# Patient Record
Sex: Male | Born: 1955 | Race: White | Hispanic: No | Smoking: Current every day smoker
Health system: Southern US, Community
[De-identification: ages and names within clinical notes are randomized; demographics above are authoritative.]

## PROBLEM LIST (undated history)

## (undated) DIAGNOSIS — R0602 Shortness of breath: Secondary | ICD-10-CM

## (undated) DIAGNOSIS — I739 Peripheral vascular disease, unspecified: Secondary | ICD-10-CM

## (undated) DIAGNOSIS — I77819 Aortic ectasia, unspecified site: Secondary | ICD-10-CM

## (undated) DIAGNOSIS — I10 Essential (primary) hypertension: Secondary | ICD-10-CM

## (undated) DIAGNOSIS — H9190 Unspecified hearing loss, unspecified ear: Secondary | ICD-10-CM

## (undated) DIAGNOSIS — M5430 Sciatica, unspecified side: Secondary | ICD-10-CM

## (undated) DIAGNOSIS — R011 Cardiac murmur, unspecified: Secondary | ICD-10-CM

## (undated) DIAGNOSIS — F1010 Alcohol abuse, uncomplicated: Secondary | ICD-10-CM

## (undated) DIAGNOSIS — Z0389 Encounter for observation for other suspected diseases and conditions ruled out: Secondary | ICD-10-CM

## (undated) DIAGNOSIS — I359 Nonrheumatic aortic valve disorder, unspecified: Secondary | ICD-10-CM

## (undated) DIAGNOSIS — R002 Palpitations: Secondary | ICD-10-CM

## (undated) DIAGNOSIS — M79609 Pain in unspecified limb: Secondary | ICD-10-CM

## (undated) DIAGNOSIS — G8921 Chronic pain due to trauma: Secondary | ICD-10-CM

## (undated) HISTORY — DX: Shortness of breath: R06.02

## (undated) HISTORY — DX: Peripheral vascular disease, unspecified: I73.9

## (undated) HISTORY — DX: Encounter for observation for other suspected diseases and conditions ruled out: Z03.89

## (undated) HISTORY — DX: Unspecified hearing loss, unspecified ear: H91.90

## (undated) HISTORY — DX: Nonrheumatic aortic valve disorder, unspecified: I35.9

## (undated) HISTORY — DX: Pain in unspecified limb: M79.609

## (undated) HISTORY — DX: Chronic pain due to trauma: G89.21

## (undated) HISTORY — DX: Palpitations: R00.2

## (undated) HISTORY — DX: Aortic ectasia, unspecified site: I77.819

## (undated) HISTORY — DX: Sciatica, unspecified side: M54.30

## (undated) HISTORY — DX: Alcohol abuse, uncomplicated: F10.10

## (undated) HISTORY — PX: WISDOM TOOTH EXTRACTION: SHX21

---

## 1979-03-28 HISTORY — PX: OTHER SURGICAL HISTORY: SHX169

## 1979-03-28 HISTORY — PX: ABDOMINAL SURGERY: SHX537

## 1982-03-27 HISTORY — PX: OTHER SURGICAL HISTORY: SHX169

## 1997-07-22 ENCOUNTER — Ambulatory Visit (HOSPITAL_BASED_OUTPATIENT_CLINIC_OR_DEPARTMENT_OTHER): Admission: RE | Admit: 1997-07-22 | Discharge: 1997-07-22 | Payer: Self-pay | Admitting: Orthopedic Surgery

## 2006-04-24 ENCOUNTER — Ambulatory Visit: Payer: Self-pay | Admitting: Cardiology

## 2010-05-19 ENCOUNTER — Encounter: Payer: Self-pay | Admitting: Cardiovascular Disease

## 2010-05-20 ENCOUNTER — Encounter: Payer: Self-pay | Admitting: Cardiovascular Disease

## 2010-05-24 ENCOUNTER — Encounter: Payer: Self-pay | Admitting: Cardiovascular Disease

## 2010-05-24 DIAGNOSIS — R011 Cardiac murmur, unspecified: Secondary | ICD-10-CM

## 2010-06-03 ENCOUNTER — Encounter: Payer: Self-pay | Admitting: Physician Assistant

## 2010-06-03 ENCOUNTER — Ambulatory Visit (INDEPENDENT_AMBULATORY_CARE_PROVIDER_SITE_OTHER): Payer: Medicaid Other | Admitting: Cardiovascular Disease

## 2010-06-03 ENCOUNTER — Encounter (INDEPENDENT_AMBULATORY_CARE_PROVIDER_SITE_OTHER): Payer: Self-pay | Admitting: *Deleted

## 2010-06-03 DIAGNOSIS — I10 Essential (primary) hypertension: Secondary | ICD-10-CM | POA: Insufficient documentation

## 2010-06-03 DIAGNOSIS — I739 Peripheral vascular disease, unspecified: Secondary | ICD-10-CM | POA: Insufficient documentation

## 2010-06-03 DIAGNOSIS — R0602 Shortness of breath: Secondary | ICD-10-CM

## 2010-06-03 DIAGNOSIS — E785 Hyperlipidemia, unspecified: Secondary | ICD-10-CM | POA: Insufficient documentation

## 2010-06-03 DIAGNOSIS — R002 Palpitations: Secondary | ICD-10-CM | POA: Insufficient documentation

## 2010-06-03 DIAGNOSIS — F17200 Nicotine dependence, unspecified, uncomplicated: Secondary | ICD-10-CM | POA: Insufficient documentation

## 2010-06-07 NOTE — Letter (Signed)
Summary: Lexiscan or Dobutamine Pharmacist, community at [facility]  [address]   [place], [place] [address]   Phone: [phone]  Fax: [phone]      [facility] Cardiovascular Services  Lexiscan or Dobutamine Cardiolite Strss Test    [facility]  Appointment Date:_  Appointment Time:_  Your doctor has ordered a CARDIOLITE STRESS TEST using a medication to stimulate exercise so that you will not have to walk on the treadmill to determine the condition of your heart during stress. If you take blood pressure medication, ask your doctor if you should take it the day of your test. You should not have anything to eat or drink at least 4 hours before your test is scheduled, and no caffeine, including decaffeinated tea and coffee, chocolate, and soft drinks for 24 hours before your test.  You will need to register at the Outpatient/Main Entrance at the hospital 15 minutes before your appointment time. It is a good idea to bring a copy of your order with you. They will direct you to the Diagnostic Imaging (Radiology) Department.  You will be asked to undress from the waist up and given a hospital gown to wear, so dress comfortably from the waist down for example: Sweat pants, shorts, or skirt Rubber soled lace up shoes (tennis shoes)  Plan on about three hours from registration to release from the hospital  You may take your medications with water the morning of your test.

## 2010-06-07 NOTE — Letter (Signed)
Summary: External Correspondence/  [facility]  External Correspondence/  [facility]   Imported By: Dorise Hiss 05/31/2010 10:13:55  _____________________________________________________________________  External Attachment:    Type:   Image     Comment:   External Document

## 2010-06-07 NOTE — Assessment & Plan Note (Signed)
Summary: NP-ABNORMAL ECHO & EKG   Visit Type:  Initial Consult Primary Provider:  Farrell Ours   History of Present Illness: patient is a 55 year old male, with no known history for disease, now referred for evaluation of an abnormal echocardiogram.  Patient recently established with Dr. Pauletta Browns office. He presented on no medications. He referred to remote history of "murmur", but had never undergone prior echocardiography. He was referred for a 2-D echo, reviewed by Dr. Andee Lineman, indicating normal LVF (EF 55-60%), with mild concentric LVH, and evidence of diastolic dysfunction. With respect to the valvular heart disease, he was found to have mild/moderate AR, and mild PR. There was also moderate dilatation of the descending aorta.  Clinically, he denies any exertional chest pain. However, he reports progressive DOE, over the past 2-3 years. He denies any symptoms suggestive of CHF. He also suggests possible RLE claudication, with associated right hip pain.  A recent 12-lead EKG, from Dr. Pauletta Browns office, demonstrated NSR at 60 BPM, with normal axis and no ischemic changes.  Preventive Screening-Counseling & Management  Alcohol-Tobacco     Smoking Status: current     Smoking Cessation Counseling: yes     Packs/Day: >1/2 PPD     Year Started: 2004  Current Medications (verified): 1)  Diclofenac Sodium 75 Mg Tbec (Diclofenac Sodium) .... Take 1 Tablet By Mouth Two Times A Day(Patient Hasn't Taken in 2 Days)  Allergies (verified): No Known Drug Allergies  Comments:  Nurse/Medical Assistant: The patient's medication list and allergies were reviewed with the patient and were updated in the Medication and Allergy Lists.  Past History:  Past Medical History: normal LVF by 2-D echo, 2/12 Valvular heart disease... 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 All other systems negative.   Vital Signs:  Patient profile:   55 year old male Height:      68 inches Weight:      192 pounds BMI:     29.30 Pulse rate:   66 / minute BP sitting:   151 / 78  (left arm) Cuff size:   large  Vitals Entered By: Carlye Grippe (June 03, 2010 2:16 PM)  Nutrition Counseling: Patient's BMI is greater than 25 and therefore counseled on weight management options.  Physical Exam  Additional Exam:  GEN: 55 year old male, sitting upright, in no distress HEENT: NCAT,PERRLA,EOMI NECK: palpable pulses, no bruits; no JVD; no TM LUNGS: CTA bilaterally HEART: RRR (S1S2); harsh, grade 2-3/6 systolic ejection murmur at the base extending to the apex; 2/6 diastolic blow along the LSB; no rubs; no gallops ABD: soft, NT; intact BS EXT: palpable below formal pulses, with soft right-sided bruit; diminished right peripheral pulses, with brisk left peripheral pulses; trace pedal edema SKIN: warm, dry MUSC: no obvious deformity NEURO: A/O (x3)     Impression & Recommendations:  Problem # 1:  SHORTNESS OF BREATH (ICD-786.05)  recent echocardiogram indicated normal LVF, but with moderate AR and evidence of diastolic dysfunction. Will schedule Lexiscan Cardiolite to rule out occult ischemia, given that his progressive DOE current represent an anginal equivalent. Will start low-dose aspirin for primary prophylaxis. Will arrange early clinic followup with myself and Dr. Andee Lineman in one month, for review of studies  and further recommendations.  Problem # 2:  HYPERTENSION (ICD-401.9)  Will initiate antihypertensive treatment with lisinopril 5 mg daily. Check BMET in one week.  Problem # 3:  CLAUDICATION, INTERMITTENT (ICD-443.9)  assess lipid status with FLP/LFT  profile.  Problem # 4:  CLAUDICATION, INTERMITTENT (ICD-443.9)  Will schedule LE segmental ABIs, to rule out significant PAD, particularly on the right side.  Problem # 5:  TOBACCO ABUSE (ICD-305.1)  the importance of smoking cessation was discussed, and patient appears motivated to do so.  Other Orders: T-Lipid Profile [phone]) T-Hepatic Function [phone]) T-Basic Metabolic Panel [id number]) ABI (ABI) Nuclear Med (Nuc Med)  Patient Instructions: 1)  Follow up with Dr. Earnestine Leys on  [facility] July 18, 2010 AT 3:15PM. 2)  Your physician discussed the risks, benefits and indications for preventive aspirin therapy. It is recommended that you start (or continue) taking 81 mg of aspirin a day. 3)  Start Lisinopril 5mg  by mouth once daily. 4)  Your physician recommends that you go to the [facility] for lab work: DO IN 1 WEEK. DO NOT EAT OR DRINK AFTER MIDNIGHT. 5)  Your physician has requested that you have an ankle brachial index (ABI). During this test an ultrasound and blood pressure cuff are used to evaluate the arteries that supply the arms and legs with blood. Allow thirty minutes for this exam. There are no restrictions or special instructions. 6)  Your physician has requested that you have an Best boy.  For further information please visit [email or web address]  Please follow instruction sheet, as given. 7)  Your physician discussed the hazards of tobacco use.  Tobacco use cessation is recommended and techniques and options to help you quit were discussed. Prescriptions: LISINOPRIL 5 MG TABS (LISINOPRIL) Take one tablet by mouth daily  #30 x 6   Entered by:   Cyril Loosen, RN, BSN   Authorized by:   Nelida Meuse, PA-C   Signed by:   Cyril Loosen, RN, BSN on 06/03/2010   Method used:   Electronically to        [facility] [phone]* (retail)       [address] [place], [place]  [address]       Ph: [phone] or  [phone]       Fax: [phone]   RxID:   [id number]   Handout requested.  Nelida Meuse, PA-C  June 03, 2010 3:24 PM I have reviewed and approved all prescriptions at the time of the office visit.

## 2010-06-09 ENCOUNTER — Encounter: Payer: Self-pay | Admitting: Physician Assistant

## 2010-06-09 DIAGNOSIS — R072 Precordial pain: Secondary | ICD-10-CM

## 2010-07-18 ENCOUNTER — Encounter: Payer: Self-pay | Admitting: Physician Assistant

## 2010-07-18 ENCOUNTER — Encounter: Payer: Self-pay | Admitting: Cardiology

## 2010-07-18 ENCOUNTER — Ambulatory Visit (INDEPENDENT_AMBULATORY_CARE_PROVIDER_SITE_OTHER): Payer: Medicaid Other | Admitting: Cardiology

## 2010-07-18 VITALS — BP 153/79 | HR 68 | Ht 68.0 in | Wt 194.0 lb

## 2010-07-18 DIAGNOSIS — I359 Nonrheumatic aortic valve disorder, unspecified: Secondary | ICD-10-CM

## 2010-07-18 DIAGNOSIS — I351 Nonrheumatic aortic (valve) insufficiency: Secondary | ICD-10-CM | POA: Insufficient documentation

## 2010-07-18 DIAGNOSIS — I10 Essential (primary) hypertension: Secondary | ICD-10-CM

## 2010-07-18 DIAGNOSIS — R0602 Shortness of breath: Secondary | ICD-10-CM

## 2010-07-18 DIAGNOSIS — I739 Peripheral vascular disease, unspecified: Secondary | ICD-10-CM

## 2010-07-18 DIAGNOSIS — I77819 Aortic ectasia, unspecified site: Secondary | ICD-10-CM

## 2010-07-18 MED ORDER — LISINOPRIL 10 MG PO TABS: 10.0000 mg | ORAL_TABLET | Freq: Every day | ORAL | Status: DC

## 2010-07-18 NOTE — Progress Notes (Signed)
HPI The patient is a 55 year old male seen recently in the office for a history of murmur. An echocardiograph indicated the patient has mild to moderate aortic regurgitation and mild pulmonary regurgitation. Associated with this is moderate dilatation of the descending aorta. His ejection fraction is 55-60% mild concentric LVH. Clinically he reported progressive exertional chest pain and dyspnea exertional the last several years. He had no heart failure symptoms. There was question of right lower extremity claudication associated with right hip pain the patient has had prior from a to his right leg. ABIs were performed and showed no significant peripheral vascular disease. It appears that at some point the patient needs a hip replacement. Because of his symptoms of dyspnea and chest pain Cardiolite study was ordered which showed normal perfusion with an ejection fraction of 48%, discordant from the findings on echocardiogram. Of note also was that the end-diastolic volume is 207 mL and end-systolic volume 107 mL. It appears that the patient has some ventricular dilatation associated with this aortic insufficiency. Presently the patient's blood pressure however is not controlled. His only lisinopril 5 mg by mouth daily. His blood pressures in the 150-160 mmHg range.  No Known Allergies  No current outpatient prescriptions on file prior to visit.    Past Medical History  Diagnosis Date  . Undiagnosed cardiac murmurs   . Heart disease, unspecified   . Aortic valve disorders   . Pulmonary valve disorders   . Aortic ectasia, unspecified site   . Peripheral vascular disease, unspecified   . Shortness of breath   . Palpitations   . Chronic pain due to trauma   . Limb pain   . Problems with hearing   . ETOH abuse     Past Surgical History  Procedure Date  . Knee and leg orif 1984    TRAUMA  . Right forearm orif 1981    TRAUMA  . Wisdom tooth extraction   . Plastic surgery left eye 1981   TRAUMA    No family history on file.  History   Social History  . Marital Status: Legally Separated    Spouse Name: N/A    Number of Children: 2  . Years of Education: N/A   Occupational History  . PAINTER    Social History Main Topics  . Smoking status: Current Everyday Smoker -- 0.8 packs/day for 8 years    Types: Cigarettes  . Smokeless tobacco: Not on file  . Alcohol Use: Yes  . Drug Use: Not on file  . Sexually Active: Not on file   Other Topics Concern  . Not on file   Social History Narrative  . No narrative on file   Review of systems:Pertinent positives as outlined above. The remainder of the 18  point review of systems is negative  PHYSICAL EXAM BP 153/79  Pulse 68  Ht 5\' 8"  (1.727 m)  Wt 194 lb (87.998 kg)  BMI 29.50 kg/m2  General: Well-developed, well-nourished in no distress Head: Normocephalic and atraumatic Eyes:PERRLA/EOMI intact, conjunctiva and lids normal Ears: No deformity or lesions Mouth:normal dentition, normal posterior pharynx Neck: Supple, no JVD.  No masses, thyromegaly or abnormal cervical nodes Lungs: Normal breath sounds bilaterally without wheezing.  Normal percussion Cardiac: regular rate and rhythm with normal S1 and S2, no S3 or S4.  PMI is normal.  No pathological murmurs 2/6 crescendo decrescendo systolic murmur as well as a 2/6 diastolic murmur at the left lower sternal border Abdomen: Normal bowel sounds, abdomen is soft  and nontender without masses, organomegaly or hernias noted.  No hepatosplenomegaly MSK: Back normal, normal gait muscle strength and tone normal Vascular: Pulse is normal in all 4 extremities Extremities: No peripheral pitting edema Neurologic: Alert and oriented x 3 Skin: Intact without lesions or rashes Lymphatics: No significant adenopathy Psychologic: Normal affect  ECG:  ASSESSMENT AND PLAN

## 2010-07-18 NOTE — Assessment & Plan Note (Signed)
Mild to moderate aortic insufficiency: The patient will need to be followed for significant aortic insufficiency. We will obtain a followup echocardiogram in one year. We'll also obtain an MRI to evaluate his aortic dilatation. The patient will need to be monitored monitor for progressive LV dilatation, declining ejection fraction her exercise tolerance. Ejection fraction is still 55-60% by echocardiogram the left ventricle end-diastolic dimension is less than 75 mm.

## 2010-07-18 NOTE — Assessment & Plan Note (Signed)
The patient needs aggressive blood pressure control and I increased his lisinopril to 10 mg by mouth daily. She will then go for a nurse visit to further increase his blood pressure dropped to needed. The addition of a diuretic may be needed. I would avoid use of a beta blocker in the setting of aortic regurgitation. He will need a followup echocardiogram on a yearly basis.

## 2010-07-18 NOTE — Patient Instructions (Signed)
   Increase Lisinopril to 10mg  daily  Nurse visit next week for blood pressure check - if BP > 140, then will further increase Lisinopril to 20mg  daily  If above change is made, will need lab (BMET) done in 7-10 days after this. Your physician wants you to follow up in: 6 months.  You will receive a reminder letter in the mail one-two months in advance.  If you don't receive a letter, please call our office to schedule the follow up appointment

## 2010-07-18 NOTE — Assessment & Plan Note (Signed)
No further follow up required.

## 2010-07-18 NOTE — Assessment & Plan Note (Signed)
No further ischemia workup required. I suspect the patient's shortness of breath is mainly due to deconditioning, certainly at this point and not related to his mild to moderate aortic regurgitation.

## 2010-07-22 ENCOUNTER — Encounter: Payer: Self-pay | Admitting: *Deleted

## 2010-07-22 ENCOUNTER — Ambulatory Visit (INDEPENDENT_AMBULATORY_CARE_PROVIDER_SITE_OTHER): Payer: Medicaid Other | Admitting: *Deleted

## 2010-07-22 VITALS — BP 135/83 | HR 66 | Ht 68.0 in | Wt 193.0 lb

## 2010-07-22 DIAGNOSIS — I10 Essential (primary) hypertension: Secondary | ICD-10-CM

## 2010-08-06 NOTE — Progress Notes (Signed)
Blood pressure stable no further adjustment in medications

## 2010-08-06 NOTE — Progress Notes (Signed)
Blood pressure stable no further adjustment in medications 

## 2010-08-25 ENCOUNTER — Encounter: Payer: Self-pay | Admitting: *Deleted

## 2010-08-30 ENCOUNTER — Encounter: Payer: Self-pay | Admitting: *Deleted

## 2010-08-30 NOTE — Progress Notes (Signed)
Pt mailed certified letter on 08/25/10 regarding lab work for FLP/LFT/BMET ordered by Gene in March.  Received response card on 6/5 signed by pt on 08/29/10.

## 2010-10-14 ENCOUNTER — Encounter: Payer: Self-pay | Admitting: Physician Assistant

## 2011-02-21 ENCOUNTER — Encounter (HOSPITAL_COMMUNITY): Payer: Self-pay | Admitting: Pharmacy Technician

## 2011-02-21 ENCOUNTER — Telehealth: Payer: Self-pay | Admitting: *Deleted

## 2011-02-21 ENCOUNTER — Ambulatory Visit (INDEPENDENT_AMBULATORY_CARE_PROVIDER_SITE_OTHER): Payer: Medicaid Other | Admitting: Cardiology

## 2011-02-21 ENCOUNTER — Encounter: Payer: Self-pay | Admitting: *Deleted

## 2011-02-21 ENCOUNTER — Encounter: Payer: Self-pay | Admitting: Cardiology

## 2011-02-21 VITALS — BP 134/74 | HR 75 | Ht 68.0 in | Wt 193.0 lb

## 2011-02-21 DIAGNOSIS — I359 Nonrheumatic aortic valve disorder, unspecified: Secondary | ICD-10-CM

## 2011-02-21 DIAGNOSIS — I10 Essential (primary) hypertension: Secondary | ICD-10-CM

## 2011-02-21 DIAGNOSIS — I351 Nonrheumatic aortic (valve) insufficiency: Secondary | ICD-10-CM

## 2011-02-21 DIAGNOSIS — R0602 Shortness of breath: Secondary | ICD-10-CM

## 2011-02-21 DIAGNOSIS — I77819 Aortic ectasia, unspecified site: Secondary | ICD-10-CM
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
## 2011-02-21 NOTE — Patient Instructions (Signed)
   Left & right heart cath - see info sheet given Follow up will be given at time of discharge from above

## 2011-02-21 NOTE — Assessment & Plan Note (Signed)
Clinically I physical examination the patient does not appear to have severe aortic insufficiency he does however have left ventricular dilatation but also has a prior history of significant hypertension. Although Cardiolite study earlier this year was negative for ischemia, clinically the patient has worsening symptoms of dyspnea and now also has developed symptoms of chest tightness. I suggested to the patient that we should proceed with a comprehensive examination with left and right heart catheterization, particularly light of the fact that the patient never had this MRI done either. I discussed the case with Dr. Kirke Corin and he will be scheduled next week for left and right heart catheterization. I discussed with the patient the risks and benefits of the procedure and is willing to proceed.

## 2011-02-21 NOTE — Assessment & Plan Note (Signed)
Blood pressures controlled with lisinopril.

## 2011-02-21 NOTE — Telephone Encounter (Signed)
Left & right heart cath + aortogram with Dr. Kirke Corin - scheduled for Wednesday, 12/5 at 8:30 AM - main lab.

## 2011-02-21 NOTE — Telephone Encounter (Signed)
No precert required 

## 2011-02-21 NOTE — Assessment & Plan Note (Signed)
May still need a formal evaluation either with CT scan or MRI pending review of the angiogram. We will leave the  decision to Dr. Kirke Corin after he has a chance to review the study.

## 2011-02-21 NOTE — Assessment & Plan Note (Signed)
No evidence of peripheral vascular disease with normal ABIs April 2012

## 2011-02-21 NOTE — Assessment & Plan Note (Addendum)
Further evaluation as outlined above although I suspect there may be a large component of deconditioning involved also, in addition to symptoms related to his depression.

## 2011-02-27 ENCOUNTER — Other Ambulatory Visit: Payer: Self-pay | Admitting: *Deleted

## 2011-02-27 DIAGNOSIS — I10 Essential (primary) hypertension: Secondary | ICD-10-CM

## 2011-02-27 MED ORDER — LISINOPRIL 10 MG PO TABS: 10.0000 mg | ORAL_TABLET | Freq: Every day | ORAL | Status: DC

## 2011-02-28 ENCOUNTER — Telehealth: Payer: Self-pay | Admitting: *Deleted

## 2011-02-28 NOTE — Telephone Encounter (Signed)
Patient previously scheduled for heart catherization on Wednesday, 12/5 with Dr. Kirke Corin at 8:30 AM - main cath lab.  Had to reschedule due to transportation/family issues.    Rescheduled for Wednesday, 12/12 at 8:30 AM.

## 2011-03-01 NOTE — Telephone Encounter (Signed)
Called and informed [facility] cath lab.

## 2011-03-07 ENCOUNTER — Other Ambulatory Visit: Payer: Self-pay | Admitting: Cardiology

## 2011-03-07 DIAGNOSIS — I2510 Atherosclerotic heart disease of native coronary artery without angina pectoris: Secondary | ICD-10-CM

## 2011-03-08 ENCOUNTER — Encounter (HOSPITAL_COMMUNITY)
Admission: RE | Disposition: A | Discharge status: Home / Self Care | Payer: Self-pay | Source: Ambulatory Visit | Attending: Cardiovascular Disease

## 2011-03-08 ENCOUNTER — Ambulatory Visit (HOSPITAL_COMMUNITY)
Admission: RE | Admit: 2011-03-08 | Discharge: 2011-03-08 | Disposition: A | Discharge status: Home / Self Care | Payer: Medicaid Other | Source: Ambulatory Visit | Attending: Cardiovascular Disease | Admitting: Cardiovascular Disease

## 2011-03-08 ENCOUNTER — Other Ambulatory Visit: Payer: Self-pay

## 2011-03-08 DIAGNOSIS — R079 Chest pain, unspecified: Secondary | ICD-10-CM | POA: Insufficient documentation

## 2011-03-08 DIAGNOSIS — I2510 Atherosclerotic heart disease of native coronary artery without angina pectoris: Secondary | ICD-10-CM

## 2011-03-08 DIAGNOSIS — I359 Nonrheumatic aortic valve disorder, unspecified: Secondary | ICD-10-CM

## 2011-03-08 DIAGNOSIS — R0989 Other specified symptoms and signs involving the circulatory and respiratory systems: Secondary | ICD-10-CM | POA: Insufficient documentation

## 2011-03-08 DIAGNOSIS — R0609 Other forms of dyspnea: Secondary | ICD-10-CM | POA: Insufficient documentation

## 2011-03-08 DIAGNOSIS — R0602 Shortness of breath: Secondary | ICD-10-CM

## 2011-03-08 HISTORY — PX: LEFT AND RIGHT HEART CATHETERIZATION WITH CORONARY ANGIOGRAM: SHX5449

## 2011-03-08 LAB — POCT I-STAT 3, VENOUS BLOOD GAS (G3P V)
Acid-base deficit: 10 mmol/L — ABNORMAL HIGH (ref 0.0–2.0)
Bicarbonate: 19.1 mEq/L — ABNORMAL LOW (ref 20.0–24.0)
Bicarbonate: 26.6 mEq/L — ABNORMAL HIGH (ref 20.0–24.0)
O2 Saturation: 58 %
O2 Saturation: 64 %
TCO2: 21 mmol/L (ref 0–100)
TCO2: 28 mmol/L (ref 0–100)
pCO2, Ven: 52.7 mmHg — ABNORMAL HIGH (ref 45.0–50.0)
pCO2, Ven: 53.5 mmHg — ABNORMAL HIGH (ref 45.0–50.0)
pH, Ven: 7.168 — CL (ref 7.250–7.300)
pO2, Ven: 37 mmHg (ref 30.0–45.0)
pO2, Ven: 39 mmHg (ref 30.0–45.0)

## 2011-03-08 LAB — POCT I-STAT 3, ART BLOOD GAS (G3+)
Bicarbonate: 25.9 mEq/L — ABNORMAL HIGH (ref 20.0–24.0)
O2 Saturation: 98 %
TCO2: 27 mmol/L (ref 0–100)
pCO2 arterial: 47.7 mmHg — ABNORMAL HIGH (ref 35.0–45.0)
pH, Arterial: 7.343 — ABNORMAL LOW (ref 7.350–7.450)
pO2, Arterial: 106 mmHg — ABNORMAL HIGH (ref 80.0–100.0)

## 2011-03-08 LAB — BASIC METABOLIC PANEL
BUN: 13 mg/dL (ref 6–23)
CO2: 26 mEq/L (ref 19–32)
Chloride: 101 mEq/L (ref 96–112)
Creatinine, Ser: 0.67 mg/dL (ref 0.50–1.35)
Glucose, Bld: 98 mg/dL (ref 70–99)

## 2011-03-08 LAB — CBC
HCT: 43 % (ref 39.0–52.0)
MCH: 32.5 pg (ref 26.0–34.0)
MCHC: 34.2 g/dL (ref 30.0–36.0)
MCV: 94.9 fL (ref 78.0–100.0)
RDW: 12.4 % (ref 11.5–15.5)

## 2011-03-08 SURGERY — LEFT AND RIGHT HEART CATHETERIZATION WITH CORONARY ANGIOGRAM
Anesthesia: LOCAL

## 2011-03-08 MED ORDER — HYDROMORPHONE HCL PF 2 MG/ML IJ SOLN
2.0000 mg | INTRAMUSCULAR | Status: DC | PRN
  Administered 2011-03-08: 2 mg via INTRAVENOUS
  Filled 2011-03-08: qty 2

## 2011-03-08 MED ORDER — SODIUM CHLORIDE 0.9 % IJ SOLN: 3.0000 mL | Freq: Two times a day (BID) | INTRAMUSCULAR | Status: DC

## 2011-03-08 MED ORDER — OXYCODONE-ACETAMINOPHEN 5-325 MG PO TABS: 1.0000 | ORAL_TABLET | ORAL | Status: DC | PRN

## 2011-03-08 MED ORDER — SODIUM CHLORIDE 0.9 % IV SOLN
INTRAVENOUS | Status: DC
  Administered 2011-03-08: 07:00:00 via INTRAVENOUS

## 2011-03-08 MED ORDER — LABETALOL HCL 5 MG/ML IV SOLN
INTRAVENOUS | Status: AC
  Filled 2011-03-08: qty 4

## 2011-03-08 MED ORDER — ONDANSETRON HCL 4 MG/2ML IJ SOLN: 4.0000 mg | Freq: Four times a day (QID) | INTRAMUSCULAR | Status: DC | PRN

## 2011-03-08 MED ORDER — ASPIRIN 81 MG PO CHEW
324.0000 mg | CHEWABLE_TABLET | ORAL | Status: AC
  Administered 2011-03-08: 324 mg via ORAL
  Filled 2011-03-08: qty 4

## 2011-03-08 MED ORDER — ACETAMINOPHEN 325 MG PO TABS: 650.0000 mg | ORAL_TABLET | ORAL | Status: DC | PRN

## 2011-03-08 MED ORDER — DIAZEPAM 5 MG PO TABS
5.0000 mg | ORAL_TABLET | ORAL | Status: AC
  Administered 2011-03-08: 5 mg via ORAL
  Filled 2011-03-08: qty 1

## 2011-03-08 MED ORDER — SODIUM CHLORIDE 0.9 % IV SOLN: 250.0000 mL | INTRAVENOUS | Status: DC | PRN

## 2011-03-08 MED ORDER — SODIUM CHLORIDE 0.9 % IV SOLN: INTRAVENOUS | Status: DC

## 2011-03-08 MED ORDER — SODIUM CHLORIDE 0.9 % IJ SOLN: 3.0000 mL | INTRAMUSCULAR | Status: DC | PRN

## 2011-03-08 MED ORDER — ROSUVASTATIN CALCIUM 40 MG PO TABS
40.0000 mg | ORAL_TABLET | ORAL | Status: AC
  Administered 2011-03-08: 40 mg via ORAL
  Filled 2011-03-08: qty 1

## 2011-03-08 NOTE — Interval H&P Note (Signed)
History and Physical Interval Note:  03/08/2011 10:06 AM  Lucas Curry  has presented today for surgery, with the diagnosis of dyspnea and aortic insufficiency.   The various methods of treatment have been discussed with the patient. After consideration of risks, benefits and other options for treatment, the patient has consented to  Procedure(s): LEFT AND RIGHT HEART CATHETERIZATION WITH CORONARY ANGIOGRAM ABDOMINAL AORTAGRAM as a surgical intervention .  The patients' history has been reviewed, patient examined, no change in status, stable for surgery.  I have reviewed the patients' chart and labs.  Questions were answered to the patient's satisfaction.     Lorine Bears

## 2011-03-08 NOTE — Op Note (Signed)
Cardiac Catheterization Procedure Note  Name: Lucas Curry MRN: [id number] DOB: Nov 13, 1955  Procedure: Right Heart Cath, Left Heart Cath, Selective Coronary Angiography, LV angiography  Indication: This is a 55 year old male with known history of aortic insufficiency who was referred for right and left cardiac catheterization due to progressive symptoms of exertional chest pain and dyspnea.   Procedural Details: The right groin was prepped, draped, and anesthetized with 1% lidocaine. Using the modified Seldinger technique a 5 French sheath was placed in the right femoral artery and a 7 French sheath was placed in the right femoral vein. A Swan-Ganz catheter was used for the right heart catheterization. Standard protocol was followed for recording of right heart pressures and sampling of oxygen saturations. Fick cardiac output was calculated. Standard Judkins catheters were used for selective coronary angiography and left ventriculography. There were no immediate procedural complications. The patient was transferred to the post catheterization recovery area for further monitoring.  Procedural Findings: Hemodynamics RA 8 mm mercury. RV 28/9 mmHg PA 27/14/18 mmHg PCWP 12 mmHg LV 179/17. EDP: 17 mmHg. AO 163/88/119 mmHg.  Oxygen saturations: PA 62% AO 98%  Cardiac Output (Fick) 3.7  Cardiac Index (Fick) 1.85   Aortic valve calculations: Mean gradient is 12.9 mmHg and peak to peak gradient is 16 mm mercury. Calculated aortic valve area is 1.14 cm  Coronary angiography: Coronary dominance: right  Left mainstem: The vessel is normal in size and free of any significant disease.  Left anterior descending (LAD): The vessel is normal in size and overall smooth. There is a 20% discrete stenosis at first diagonal branch. The rest of the LAD is free of significant disease. First diagonal is normal in size and free of significant disease. Second and third diagonals are very  tiny.  Left circumflex (LCx): The vessel is normal in size and nondominant. It has minor irregularities without obstructive disease. There is a high OM1 which is large and free of significant disease. OM 2 is small in size. OM 3 is normal in size and free of significant disease.  Right coronary artery (RCA): The vessel is normal in size and tortuous in the proximal segment. It has a downward takeoff. There is minor irregularities without evidence of obstructive disease.  Left ventriculography: The left ventricle appears to be mildly to moderately dilated. Left ventricular systolic function is low normal, LVEF is estimated at 50-55%, there is no significant mitral regurgitation   Aortic root angiography: This was performed in the LAO 40 position. The extending aorta appears to be moderately dilated. There is evidence of moderate to severe aortic insufficiency.  Final Conclusions:  1. Normal filling pressures without significant pulmonary hypertension. 2. Moderately to severely reduced cardiac output. 3. Moderate aortic stenosis with a mean gradient of 12.9 mm mercury and a calculated valve area of 1.14 cm. The gradient across the aortic valve might be also partially due to aortic insufficiency. 4. Low normal LV systolic function which is abnormal considering the degree of aortic insufficiency. 5. Moderate to severe aortic insufficiency with moderately dilated descending aorta. 6. No significant coronary artery disease.  Recommendations: The aortic insufficiency appears to be moderate to severe. The low normal LV systolic function and reduced cardiac output is concerning. A transesophageal echocardiogram might be helpful to evaluate the morphology of the aortic valve and establishing the need for valve surgery.   Lorine Bears 03/08/2011, 10:57 AM
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
## 2011-03-13 ENCOUNTER — Telehealth: Payer: Self-pay | Admitting: *Deleted

## 2011-03-13 NOTE — Telephone Encounter (Signed)
Discussed below with patient.  OV scheduled for 03/29/2011.

## 2011-03-13 NOTE — Telephone Encounter (Signed)
Message copied by Murriel Hopper on Mon Mar 13, 2011  2:01 PM ------      Message from: Lewayne Bunting E      Created: Wed Mar 08, 2011  1:44 PM      Regarding: Transesophageal echocardiogram       Dr. Kirke Corin just perform a cardiac catheterization on this patient. He has significant aortic insufficiency and needs a transesophageal echocardiogram. Please schedule this first available. If patient wants to talk with me about the results of the catheterization and the need for transesophageal echocardiogram and office visits can be scheduled first but should not be delayed for more than a few weeks.

## 2011-03-29 ENCOUNTER — Telehealth: Payer: Self-pay | Admitting: *Deleted

## 2011-03-29 ENCOUNTER — Ambulatory Visit (INDEPENDENT_AMBULATORY_CARE_PROVIDER_SITE_OTHER): Payer: Medicaid Other | Admitting: Cardiology

## 2011-03-29 ENCOUNTER — Encounter: Payer: Self-pay | Admitting: *Deleted

## 2011-03-29 ENCOUNTER — Encounter: Payer: Self-pay | Admitting: Cardiology

## 2011-03-29 VITALS — BP 146/74 | HR 86 | Ht 68.0 in | Wt 198.0 lb

## 2011-03-29 DIAGNOSIS — I359 Nonrheumatic aortic valve disorder, unspecified: Secondary | ICD-10-CM

## 2011-03-29 DIAGNOSIS — Z0389 Encounter for observation for other suspected diseases and conditions ruled out: Secondary | ICD-10-CM

## 2011-03-29 DIAGNOSIS — I351 Nonrheumatic aortic (valve) insufficiency: Secondary | ICD-10-CM

## 2011-03-29 MED ORDER — ACETAMINOPHEN 500 MG PO TABS: 1000.0000 mg | ORAL_TABLET | Freq: Four times a day (QID) | ORAL | Status: DC | PRN

## 2011-03-29 MED ORDER — TRAMADOL HCL 50 MG PO TABS: ORAL_TABLET | ORAL | Status: DC
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
## 2011-03-29 NOTE — Telephone Encounter (Signed)
TEE scheduled tomorrow 1/3 at 1:00 with GD.

## 2011-03-29 NOTE — Telephone Encounter (Signed)
No precert required.  Medicaid only 

## 2011-03-29 NOTE — Patient Instructions (Addendum)
   Tramadol 100mg  every 6 hours as needed   Tylenol 1000mg  every 6 hours as needed, take together with Tramadol  TEE - see istruction sheet given  Follow up in  4-6 weeks - see above

## 2011-03-30 ENCOUNTER — Encounter: Payer: Self-pay | Admitting: *Deleted

## 2011-03-30 DIAGNOSIS — I359 Nonrheumatic aortic valve disorder, unspecified: Secondary | ICD-10-CM

## 2011-04-13 ENCOUNTER — Encounter: Payer: Self-pay | Admitting: Cardiology

## 2011-05-08 ENCOUNTER — Encounter: Payer: Self-pay | Admitting: Cardiology

## 2011-05-08 ENCOUNTER — Ambulatory Visit (INDEPENDENT_AMBULATORY_CARE_PROVIDER_SITE_OTHER): Payer: Medicaid Other | Admitting: Cardiology

## 2011-05-08 VITALS — BP 172/76 | HR 90 | Ht 68.0 in | Wt 197.0 lb

## 2011-05-08 DIAGNOSIS — I359 Nonrheumatic aortic valve disorder, unspecified: Secondary | ICD-10-CM

## 2011-05-08 DIAGNOSIS — I77819 Aortic ectasia, unspecified site: Secondary | ICD-10-CM

## 2011-05-08 DIAGNOSIS — M5430 Sciatica, unspecified side: Secondary | ICD-10-CM

## 2011-05-08 MED ORDER — NIFEDIPINE ER 30 MG PO TB24: 30.0000 mg | ORAL_TABLET | Freq: Every day | ORAL | Status: DC

## 2011-05-08 MED ORDER — CHLORTHALIDONE 25 MG PO TABS: ORAL_TABLET | ORAL | Status: DC

## 2011-05-08 NOTE — Patient Instructions (Signed)
   Adalat CC 30mg  daily   Chlorthalidone 12.5mg  daily  Check blood pressure at [facility] - call with results   Echo in 6 months prior to next office visit  Your physician wants you to follow up in: 6 months.  You will receive a reminder letter in the mail one-two months in advance.  If you don't receive a letter, please call our office to schedule the follow up appointment
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
## 2011-05-22 ENCOUNTER — Telehealth: Payer: Self-pay | Admitting: *Deleted

## 2011-05-22 NOTE — Telephone Encounter (Signed)
02/14 124/74 02/15 140/84 02/16 139/91 02/18 133/85 02/19 119/82 02/20 163/88 02/21 136/84 02/22 135/85 02/23 128/80 02/25 139/83 

## 2011-05-23 NOTE — Telephone Encounter (Signed)
Those are good blood pressure readings would not change medications for now.

## 2011-05-23 NOTE — Telephone Encounter (Signed)
Patient informed. 

## 2011-07-13 ENCOUNTER — Telehealth: Payer: Self-pay | Admitting: *Deleted

## 2011-07-13 NOTE — Telephone Encounter (Signed)
Patient having this injection at [facility] on April 24th and was informed to call our office to inquire if he should hold his aspirin before procedure is done.

## 2011-07-14 NOTE — Telephone Encounter (Signed)
The question should be referred to the MD who is doing the injection. They should be able to explain to the pt if holding ASA is required.

## 2011-07-14 NOTE — Telephone Encounter (Signed)
Patient informed. 

## 2011-10-17 ENCOUNTER — Encounter: Payer: Self-pay | Admitting: Physician Assistant

## 2011-10-17 ENCOUNTER — Ambulatory Visit (INDEPENDENT_AMBULATORY_CARE_PROVIDER_SITE_OTHER): Payer: Medicaid Other | Admitting: Physician Assistant

## 2011-10-17 VITALS — BP 109/57 | HR 73 | Ht 68.0 in | Wt 197.0 lb

## 2011-10-17 DIAGNOSIS — R0602 Shortness of breath: Secondary | ICD-10-CM

## 2011-10-17 DIAGNOSIS — Z0389 Encounter for observation for other suspected diseases and conditions ruled out: Secondary | ICD-10-CM

## 2011-10-17 DIAGNOSIS — Z01810 Encounter for preprocedural cardiovascular examination: Secondary | ICD-10-CM

## 2011-10-17 DIAGNOSIS — R609 Edema, unspecified: Secondary | ICD-10-CM

## 2011-10-17 DIAGNOSIS — I359 Nonrheumatic aortic valve disorder, unspecified: Secondary | ICD-10-CM

## 2011-10-17 MED ORDER — FUROSEMIDE 20 MG PO TABS: 20.0000 mg | ORAL_TABLET | Freq: Every day | ORAL | Status: DC

## 2011-10-17 NOTE — Patient Instructions (Addendum)
STOP CHLORTHALIDONE  START LASIX 20 MG DAILY  Your physician recommends that you return for lab work in: [facility] OFFICE BMET IN 1 WEEK  Your physician has requested that you have an echocardiogram TO BE DONE IN THE [facility] OFFICE AS WELL, CAN BE DONE THE SAME DAY AS LAB.Marland Kitchen Echocardiography is a painless test that uses sound waves to create images of your heart. It provides your doctor with information about the size and shape of your heart and how well your heart's chambers and valves are working. This procedure takes approximately one hour. There are no restrictions for this procedure.  A chest x-ray THIS NEEDS TO BE DONE IN THE NEXT FEW DAYS IN THE [facility] OFFICE OR AT [facility], WHICH EVER IS MORE CONVEINT FOR THE PT. takes a picture of the organs and structures inside the chest, including the heart, lungs, and blood vessels. This test can show several things, including, whether the heart is enlarges; whether fluid is building up in the lungs; and whether pacemaker / defibrillator leads are still in place.  KEEP F/U APPT. WITH DR. Andee Lineman IN THE [facility] OFFICE 11/06/11

## 2011-10-17 NOTE — Progress Notes (Addendum)
[address] [name], [place]  [address] Phone: [phone] Fax:  [phone]  Date:  10/17/2011   Name:  Lucas Curry   DOB:  01/27/1956   MRN:  [id number]  PCP:  Avon Gully, MD  Primary Cardiologist:  Dr. Lewayne Bunting [facility]) Primary Electrophysiologist:  None    History of Present Illness: Lucas Curry is a 56 y.o. male who presents to the [facility] office for surgical clearance.  He has a history of aortic insufficiency, PAD.    LHC 02/2011: LAD 20% at D1, EF 50-55%, moderate to severe aortic insufficiency, PCWP 12, EDP 17, cardiac output 3.7.   TEE 03/2011: EF 55-60%, moderate LVH, moderate AI, trace MR, trace TR, moderate dilatation of the aortic root, mild dilatation of the ascending aorta, grade 2 atherosclerotic disease in the descending aorta.    Last seen by Dr. Andee Lineman 04/2011.  Blood pressure medicines were adjusted and the plan was to continue q.6 month echocardiograms to follow his AI with the thought he would eventually need surgery.  He needs elective sinus surgery with a Dr. Andrey Campanile in [place], [place].  The patient notes worsening dyspnea with exertion since he saw Dr. Andee Lineman in 04/2011.  He probably describes class IIb-III symptoms.  He sleeps on one to 2 pillows.  He denies PND.  He has noted LE edema which is new.  He denies syncope but occasionally gets dizzy.  He denies chest pain.  Wt Readings from Last 3 Encounters:  10/17/11 197 lb (89.359 kg)  05/08/11 197 lb (89.359 kg)  03/29/11 198 lb (89.812 kg)      Past Medical History  Diagnosis Date  . Aortic valve disorders     moderate to severe aortic insufficiency both by catheterization and echocardiogram.  Ejection fraction 55-60%, low cardiac output.  Cardiac catheterization 1212 calculated valve area 1.14 cm made the abnormal secondary to aortic insufficiency and no significant coronary artery disease.  Normal filling pressures without significant pulmonary hypertension   . Aortic ectasia, unspecified site     Aortic dilatation  . Peripheral vascular disease, unspecified     normal ABIs 2012  . 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 Procedure January 2012  . Palpitations   . Chronic pain due to trauma     patient walks with a limp status post pinning of his right leg  . Limb pain   . Problems with hearing   . ETOH abuse   . Abdominal pain     chronic secondary to scar  . Coronary artery disease (CAD) excluded     Status post cardiac catheterization  . Sciatica     Current Outpatient Prescriptions  Medication Sig Dispense Refill  . albuterol (PROVENTIL HFA;VENTOLIN HFA) 108 (90 BASE) MCG/ACT inhaler Inhale 2 puffs into the lungs every 6 (six) hours as needed.      Marland Kitchen aspirin 81 MG tablet Take 81 mg by mouth daily.        . chlorthalidone (HYGROTON) 25 MG tablet Take 1/2 tab (12.5mg ) daily  15 tablet  6  . HYDROcodone-acetaminophen (VICODIN) 5-500 MG per tablet Take 1 tablet by mouth 3 (three) times daily.       Marland Kitchen lisinopril (PRINIVIL,ZESTRIL) 10 MG tablet Take 1 tablet (10 mg total) by mouth daily.  30 tablet  6  . NIFEdipine (PROCARDIA-XL/ADALAT CC) 30 MG 24 hr tablet Take 1  tablet (30 mg total) by mouth daily.  30 tablet  6    Allergies: No Known Allergies  History  Substance Use Topics  . Smoking status: Former Smoker -- 0.8 packs/day for 8 years    Types: Cigarettes    Quit date: 10/16/1996  . Smokeless tobacco: Former Neurosurgeon    Quit date: 10/16/1996  . Alcohol Use: Yes     ROS:  Please see the history of present illness.     All other systems reviewed and negative.   PHYSICAL EXAM: VS:  BP 109/57  Pulse 73  Ht 5\' 8"  (1.727 m)  Wt 197 lb (89.359 kg)  BMI 29.95 kg/m2 Well nourished, well developed, in no acute distress HEENT: normal Neck: no JVD Cardiac:  normal S1, S2; 2/6 systolic ejection murmur  heard best along the LSB; 2/6 diastolic murmur heard best at the third left ICS Lungs:  clear to auscultation bilaterally, no wheezing, rhonchi or rales Abd: soft, nontender, no hepatomegaly Ext: Trace to 1+ bilateral LE edema Skin: warm and dry Neuro:  CNs 2-12 intact, no focal abnormalities noted  EKG:  Sinus rhythm, heart rate 80, normal axis, no acute changes      ASSESSMENT AND PLAN:  1.  Aortic insufficiency His dyspnea is worsening.  He does have some lower extremity edema.  His lungs are clear.  I am not convinced that he is experiencing clinical congestive heart failure.  However, I think he needs a follow up echocardiogram before we clear him for surgery.  He already has an appointment with Dr. Andee Lineman next month.  I will arrange a follow up echocardiogram prior to that visit.  I will obtain a chest x-ray as well.  Further recommendations regarding his aortic insufficiency will be per Dr. Andee Lineman.  2.  Edema His edema may likely be due to nifedipine.  However, he has been on nifedipine for several months and did not note LE edema until recently.  Discontinue chlorthalidone and start Lasix 20 mg daily.  Check a basic metabolic panel in one week.  Obtain an echocardiogram as noted.  Follow up with Dr. Andee Lineman as planned.  3.  Chronic sinusitis I do not recommend proceeding with surgery until he has had a follow up echocardiogram and follow up with his primary cardiologist, Dr. Andee Lineman next month.  Luna Glasgow, PA-C  10:42 AM 10/17/2011

## 2011-10-25 ENCOUNTER — Other Ambulatory Visit: Payer: Self-pay

## 2011-10-25 ENCOUNTER — Other Ambulatory Visit (INDEPENDENT_AMBULATORY_CARE_PROVIDER_SITE_OTHER): Payer: Medicaid Other

## 2011-10-25 ENCOUNTER — Encounter: Payer: Self-pay | Admitting: Physician Assistant

## 2011-10-25 DIAGNOSIS — I359 Nonrheumatic aortic valve disorder, unspecified: Secondary | ICD-10-CM

## 2011-10-25 DIAGNOSIS — Z0389 Encounter for observation for other suspected diseases and conditions ruled out: Secondary | ICD-10-CM

## 2011-10-25 DIAGNOSIS — R0602 Shortness of breath: Secondary | ICD-10-CM

## 2011-11-06 ENCOUNTER — Ambulatory Visit (INDEPENDENT_AMBULATORY_CARE_PROVIDER_SITE_OTHER): Payer: Medicaid Other | Admitting: Cardiology

## 2011-11-06 VITALS — BP 100/58 | HR 81 | Ht 68.0 in | Wt 192.5 lb

## 2011-11-06 DIAGNOSIS — I359 Nonrheumatic aortic valve disorder, unspecified: Secondary | ICD-10-CM

## 2011-11-06 DIAGNOSIS — I77819 Aortic ectasia, unspecified site: Secondary | ICD-10-CM

## 2011-11-06 NOTE — Patient Instructions (Addendum)
Your physician recommends that you schedule a follow-up appointment in: 6 months with Dr. Degent. You will receive a reminder letter in the mail in about 4 months reminding you to call and schedule your appointment. If you don't receive this letter, please contact our office.   Your physician recommends that you continue on your current medications as directed. Please refer to the Current Medication list given to you today.  

## 2011-12-03 NOTE — Assessment & Plan Note (Signed)
More recent echo suggested moderate stable aortic insufficiency.

## 2011-12-03 NOTE — Progress Notes (Signed)
Lucas Bottoms, MD, [facility] Board Certified in Adult Cardiovascular Medicine,Internal Medicine and Critical Care Medicine    CC:      followup patient with aortic dilatation and aortic insufficiency                                                                           HPI:        The patient has a history of moderate to severe aortic stenosis.  Repeat echocardiogram or aortic insufficiency.  There is aortic root dilatation.  This is followed by MRI.  Recent echo showed an ejection fraction of 60% with normal left ventricular end-diastolic and end-systolic parameters/dimensions. The patient denies any symptoms of dyspnea on exertion.  He does have some chronic sinusitis which makes it difficult for him to breathe.  However he has no chest pain orthopnea or PND.  He appears to have no heart failure symptoms  PMH: reviewed and listed in Problem List in Electronic Records (and see below) Past Medical History  Diagnosis Date  . Aortic valve disorders     moderate to severe aortic insufficiency both by catheterization and echocardiogram.  Ejection fraction 55-60%, low cardiac output.  Cardiac catheterization 1212 calculated valve area 1.14 cm made the abnormal secondary to aortic insufficiency and no significant coronary artery disease.  Normal filling pressures without significant pulmonary hypertension  . Aortic ectasia, unspecified site     Aortic dilatation  . Peripheral vascular disease, unspecified     normal ABIs 2012  . 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 Procedure January 2012  . Palpitations   . Chronic pain due to trauma     patient walks with a limp status post pinning of his right leg  . Limb pain   . Problems with hearing   . ETOH abuse   . Abdominal pain     chronic secondary to scar  . Coronary artery disease (CAD) excluded     Status  post cardiac catheterization  . Sciatica    Past Surgical History  Procedure Date  . Knee and leg orif 1984    TRAUMA  . Right forearm orif 1981    TRAUMA  . Wisdom tooth extraction   . Plastic surgery left eye 1981    TRAUMA    Allergies/SH/FHX : available in Electronic Records for review  No Known Allergies History   Social History  . Marital Status: Legally Separated    Spouse Name: N/A    Number of Children: 2  . Years of Education: N/A   Occupational History  . PAINTER    Social History Main Topics  . Smoking status: Former Smoker -- 0.8 packs/day for 8 years    Types: Cigarettes    Quit date: 10/16/1996  . Smokeless tobacco: Former Neurosurgeon    Quit date: 10/16/1996  . Alcohol Use: Yes  . Drug Use: Not on file  . Sexually Active: Not on file   Other Topics Concern  . Not on file   Social History Narrative  . No narrative on file   No family history on file.  Medications: Current Outpatient Prescriptions  Medication Sig Dispense Refill  . albuterol (PROVENTIL HFA;VENTOLIN HFA) 108 (90 BASE) MCG/ACT inhaler Inhale 2 puffs into the lungs every 6 (six) hours as needed.      Marland Kitchen aspirin 81 MG tablet Take 81 mg by mouth daily.        . furosemide (LASIX) 20 MG tablet Take 1 tablet (20 mg total) by mouth daily.  30 tablet  11  . HYDROcodone-acetaminophen (VICODIN) 5-500 MG per tablet Take 1 tablet by mouth 3 (three) times daily.       Marland Kitchen lisinopril (PRINIVIL,ZESTRIL) 10 MG tablet Take 1 tablet (10 mg total) by mouth daily.  30 tablet  6  . NIFEdipine (PROCARDIA-XL/ADALAT CC) 30 MG 24 hr tablet Take 1 tablet (30 mg total) by mouth daily.  30 tablet  6    ROS: No nausea or vomiting. No fever or chills.No melena or hematochezia.No bleeding.No claudication  Physical Exam: BP 100/58  Pulse 81  Ht 5\' 8"  (1.727 m)  Wt 192 lb 8 oz (87.317 kg)  BMI 29.27 kg/m2 General:well-nourished white male in no distress. Neck:normal carotid upstroke and no carotid bruits.  No  Duroziez sign Lungs:Clear breath sounds bilaterally no wheezing Cardiac:regular rate and rhythm with normal S1 and S2 2/6 diastolic murmur extending intomid diastole Vascular:no edema.  Normal distal pulses. Skin:note Quincky pulse Physcologic:normal affect  12lead ECG: Limited bedside ECHO:N/A No images are attached to the encounter.   I reviewed and summarized the old records. I reviewed ECG and prior blood work.  Assessment and Plan  Aortic valve disorders More recent echo suggested moderate stable aortic insufficiency.  Aortic dilatation followup MRI for aortic dilatation    Patient Active Problem List  Diagnosis  . HYPERLIPIDEMIA  . TOBACCO ABUSE  . HYPERTENSION  . CLAUDICATION, INTERMITTENT  . PALPITATIONS  . Shortness of breath  . Aortic dilatation  . Coronary artery disease (CAD) excluded  . Aortic valve disorders  . Aortic ectasia, unspecified site  . Sciatica

## 2011-12-03 NOTE — Assessment & Plan Note (Signed)
followup MRI for aortic dilatation

## 2011-12-06 ENCOUNTER — Other Ambulatory Visit: Payer: Self-pay | Admitting: Cardiology

## 2011-12-06 MED ORDER — NIFEDIPINE ER 30 MG PO TB24: 30.0000 mg | ORAL_TABLET | Freq: Every day | ORAL | Status: DC

## 2012-03-06 ENCOUNTER — Other Ambulatory Visit: Payer: Self-pay | Admitting: *Deleted

## 2012-03-06 MED ORDER — NIFEDIPINE ER 30 MG PO TB24: 30.0000 mg | ORAL_TABLET | Freq: Every day | ORAL | Status: DC

## 2012-06-18 ENCOUNTER — Other Ambulatory Visit: Payer: Self-pay | Admitting: Cardiology

## 2012-06-18 MED ORDER — NIFEDIPINE ER 30 MG PO TB24: 30.0000 mg | ORAL_TABLET | Freq: Every day | ORAL | Status: DC

## 2012-12-03 ENCOUNTER — Other Ambulatory Visit: Payer: Self-pay | Admitting: Cardiology

## 2012-12-13 ENCOUNTER — Ambulatory Visit: Payer: Medicaid Other | Admitting: Cardiovascular Disease

## 2012-12-23 ENCOUNTER — Ambulatory Visit (INDEPENDENT_AMBULATORY_CARE_PROVIDER_SITE_OTHER): Payer: Medicaid Other | Admitting: Cardiovascular Disease

## 2012-12-23 ENCOUNTER — Encounter: Payer: Self-pay | Admitting: Cardiovascular Disease

## 2012-12-23 VITALS — BP 137/90 | HR 90 | Ht 68.0 in | Wt 184.0 lb

## 2012-12-23 DIAGNOSIS — Z01810 Encounter for preprocedural cardiovascular examination: Secondary | ICD-10-CM

## 2012-12-23 DIAGNOSIS — R0602 Shortness of breath: Secondary | ICD-10-CM

## 2012-12-23 DIAGNOSIS — Z0389 Encounter for observation for other suspected diseases and conditions ruled out: Secondary | ICD-10-CM

## 2012-12-23 DIAGNOSIS — F17200 Nicotine dependence, unspecified, uncomplicated: Secondary | ICD-10-CM

## 2012-12-23 DIAGNOSIS — Z720 Tobacco use: Secondary | ICD-10-CM

## 2012-12-23 DIAGNOSIS — I10 Essential (primary) hypertension: Secondary | ICD-10-CM

## 2012-12-23 DIAGNOSIS — I77819 Aortic ectasia, unspecified site: Secondary | ICD-10-CM

## 2012-12-23 DIAGNOSIS — I359 Nonrheumatic aortic valve disorder, unspecified: Secondary | ICD-10-CM

## 2012-12-23 NOTE — Progress Notes (Signed)
Patient ID: Lucas Curry, male   DOB: November 12, 1955, 57 y.o.   MRN: [id number]   SUBJECTIVE: The patient is a 57 year old male with a history of moderate to severe aortic insufficiency status post cardiac catheterization with no significant coronary artery disease. He had normal filling pressures without significant pulmonary hypertension. 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 Procedure was done in January of 2013. The patient does have chronic dyspnea and remains short of breath on minimal exertion. He reports no substernal chest pain. He had normal ABI's in March 2012.  His most recent TTE in July 2013 showed moderate aortic regurgitation (results below):  - Left ventricle: The cavity size was normal. Wall thickness was increased in a pattern of mild LVH. Systolic function was normal. The estimated ejection fraction was in the range of 55% to 60%. Wall motion was normal; there were no regional wall motion abnormalities. Doppler parameters are consistent with abnormal left ventricular relaxation (grade 1 diastolic dysfunction). - Aortic valve: Trileaflet; mildly calcified leaflets. Cusp separation was at the lower limits of normal. Moderate regurgitation directed eccentrically in the LVOT. Mean gradient: 11mm Hg (S). Valve area: 2.88cm^2(VTI). - Aortic root: The aortic root was moderately dilated. - Mitral valve: Trivial regurgitation. - Left atrium: The atrium was at the upper limits of normal in size. - Tricuspid valve: Trivial regurgitation.  Cardiac cath (December 2012): Left mainstem: The vessel is normal in size and free of any significant disease.  Left anterior descending (LAD): The vessel is normal in size and overall smooth. There is a 20% discrete stenosis at first diagonal branch. The rest of the LAD is free of significant disease. First diagonal is normal in size and  free of significant disease. Second and third diagonals are very tiny.  Left circumflex (LCx): The vessel is normal in size and nondominant. It has minor irregularities without obstructive disease. There is a high OM1 which is large and free of significant disease. OM 2 is small in size. OM 3 is normal in size and free of significant disease.  Right coronary artery (RCA): The vessel is normal in size and tortuous in the proximal segment. It has a downward takeoff. There is minor irregularities without evidence of obstructive disease.   He smokes less than 1 ppd.  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     No Known Allergies  Current Outpatient Prescriptions  Medication Sig Dispense Refill  . aspirin 81 MG tablet Take 81 mg by mouth daily.        . furosemide (LASIX) 20 MG tablet Take 20 mg by mouth daily.      Marland Kitchen lisinopril (PRINIVIL,ZESTRIL) 10 MG tablet Take 10 mg by mouth daily.      Marland Kitchen NIFEdipine (PROCARDIA-XL/ADALAT CC) 30 MG 24 hr tablet Take 1 tablet (30 mg total) by mouth daily.  30 tablet  3  . oxycodone-acetaminophen (PERCOCET) 2.5-325 MG per tablet Take 1 tablet by mouth 2 (two) times daily between meals as needed for pain.       No current facility-administered medications for this visit.    Past Medical History  Diagnosis Date  . Aortic valve disorders     moderate to severe aortic insufficiency both by catheterization and echocardiogram.  Ejection fraction 55-60%, low cardiac output.  Cardiac catheterization 1212 calculated valve area 1.14 cm made the abnormal secondary to aortic  insufficiency and no significant coronary artery disease.  Normal filling pressures without significant pulmonary hypertension  . Aortic ectasia, unspecified site     Aortic dilatation  . Peripheral vascular disease, unspecified     normal ABIs 2012  . 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 Procedure January 2012  . Palpitations   . Chronic pain due to trauma     patient walks with a limp status post pinning of his right leg  . Limb pain   . Problems with hearing   . ETOH abuse   . Abdominal pain     chronic secondary to scar  . Coronary artery disease (CAD) excluded     Status post cardiac catheterization  . Sciatica     Past Surgical History  Procedure Laterality Date  . Knee and leg orif  1984    TRAUMA  . Right forearm orif  1981    TRAUMA  . Wisdom tooth extraction    . Plastic surgery left eye  1981    TRAUMA    History   Social History  . Marital Status: Legally Separated    Spouse Name: N/A    Number of Children: 2  . Years of Education: N/A   Occupational History  . PAINTER    Social History Main Topics  . Smoking status: Former Smoker -- 0.80 packs/day for 8 years    Types: Cigarettes    Quit date: 10/16/1996  . Smokeless tobacco: Former Neurosurgeon    Quit date: 10/16/1996  . Alcohol Use: Yes  . Drug Use: Not on file  . Sexual Activity: Not on file   Other Topics Concern  . Not on file   Social History Narrative  . No narrative on file     Filed Vitals:   12/23/12 1458  BP: 137/90  Pulse: 90  Height: 5\' 8"  (1.727 m)  Weight: 184 lb (83.462 kg)    PHYSICAL EXAM General: NAD Neck: No JVD, no thyromegaly or thyroid nodule.  Lungs: Clear to auscultation bilaterally with normal respiratory effort. CV: Nondisplaced PMI.  Heart regular S1/S2, no S3/S4, III/VI apical holosystolic murmur, II/IV diastolic decrescendo murmur at LLSB.  No peripheral edema.  No carotid bruit.  Normal pedal pulses.  Abdomen: Soft, nontender, no hepatosplenomegaly, no distention.  Neurologic: Alert and oriented x 3.  Psych: Normal affect. Extremities: No clubbing or cyanosis.   ECG: reviewed and available in electronic records. (NSR, 87  bpm)      ASSESSMENT AND PLAN: 1. Preoperative cardiovascular risk stratification: given that his coronary arteries were essentially normal by cardiac catheterization in 02/2011, I am not inclined to purse an ischemic evaluation. I will obtain an echocardiogram to assess the severity of both his aortic regurgitation, as well as to assess his aortic root dilatation. His BP is under reasonably good control. Given his chronic dyspnea and long h/o tobacco use, I will obtain pulmonary function tests.  2. HTN: well controlled on current medical regimen. 3. Aortic regurgitation: will repeat echocardiogram to assess severity. 4. Aortic root dilatation: will repeat echocardiogram to assess severity. 5. Dyspnea on exertion: PFT's as above, given long h/o tobacco use.   Prentice Docker, M.D., F.A.C.C.

## 2012-12-23 NOTE — Patient Instructions (Addendum)
Your physician has requested that you have an echocardiogram. Echocardiography is a painless test that uses sound waves to create images of your heart. It provides your doctor with information about the size and shape of your heart and how well your heart's chambers and valves are working. This procedure takes approximately one hour. There are no restrictions for this procedure. Your physician has recommended that you have a pulmonary function test. Pulmonary Function Tests are a group of tests that measure how well air moves in and out of your lungs. Continue all current medications. Office will contact with results via phone or letter.   Your physician wants you to follow up in: 6 months.  You will receive a reminder letter in the mail one-two months in advance.  If you don't receive a letter, please call our office to schedule the follow up appointment

## 2013-01-01 ENCOUNTER — Other Ambulatory Visit: Payer: Medicaid Other

## 2013-01-02 ENCOUNTER — Other Ambulatory Visit: Payer: Self-pay

## 2013-01-02 ENCOUNTER — Other Ambulatory Visit (INDEPENDENT_AMBULATORY_CARE_PROVIDER_SITE_OTHER): Payer: Medicaid Other

## 2013-01-02 DIAGNOSIS — R0609 Other forms of dyspnea: Secondary | ICD-10-CM

## 2013-01-02 DIAGNOSIS — I359 Nonrheumatic aortic valve disorder, unspecified: Secondary | ICD-10-CM

## 2013-01-02 DIAGNOSIS — Z0389 Encounter for observation for other suspected diseases and conditions ruled out: Secondary | ICD-10-CM

## 2013-01-03 LAB — PULMONARY FUNCTION TEST

## 2013-01-09 ENCOUNTER — Other Ambulatory Visit: Payer: Self-pay | Admitting: *Deleted

## 2013-01-09 DIAGNOSIS — R0602 Shortness of breath: Secondary | ICD-10-CM

## 2013-01-09 DIAGNOSIS — Z720 Tobacco use: Secondary | ICD-10-CM

## 2013-01-14 ENCOUNTER — Encounter: Payer: Self-pay | Admitting: *Deleted

## 2013-01-16 ENCOUNTER — Telehealth: Payer: Self-pay | Admitting: *Deleted

## 2013-01-16 NOTE — Telephone Encounter (Signed)
Can proceed with back surgery with an acceptable cardiac risk for major adverse events (intermediate risk).

## 2013-01-16 NOTE — Telephone Encounter (Signed)
Patient called to see if he is able to have his surgery since all testing requested is completed.

## 2013-01-16 NOTE — Telephone Encounter (Signed)
Patient informed and copy sent to Dr. Temple Pacini office.

## 2013-03-27 HISTORY — PX: LUMBAR FUSION: SHX111

## 2013-04-01 ENCOUNTER — Encounter: Payer: Self-pay | Admitting: Cardiovascular Disease

## 2013-06-03 ENCOUNTER — Ambulatory Visit: Payer: Medicaid Other | Attending: Neurosurgery | Admitting: Physical Therapy

## 2013-06-03 DIAGNOSIS — R293 Abnormal posture: Secondary | ICD-10-CM | POA: Insufficient documentation

## 2013-06-03 DIAGNOSIS — R5381 Other malaise: Secondary | ICD-10-CM | POA: Insufficient documentation

## 2013-06-03 DIAGNOSIS — M545 Low back pain, unspecified: Secondary | ICD-10-CM | POA: Insufficient documentation

## 2013-06-05 ENCOUNTER — Ambulatory Visit: Payer: Medicaid Other | Admitting: *Deleted

## 2013-06-09 ENCOUNTER — Ambulatory Visit (INDEPENDENT_AMBULATORY_CARE_PROVIDER_SITE_OTHER): Payer: Medicaid Other | Admitting: Cardiovascular Disease

## 2013-06-09 ENCOUNTER — Encounter: Payer: Self-pay | Admitting: Cardiovascular Disease

## 2013-06-09 VITALS — BP 147/90 | HR 91 | Ht 68.0 in | Wt 191.0 lb

## 2013-06-09 DIAGNOSIS — R0602 Shortness of breath: Secondary | ICD-10-CM

## 2013-06-09 DIAGNOSIS — I351 Nonrheumatic aortic (valve) insufficiency: Secondary | ICD-10-CM

## 2013-06-09 DIAGNOSIS — I77819 Aortic ectasia, unspecified site: Secondary | ICD-10-CM

## 2013-06-09 DIAGNOSIS — I10 Essential (primary) hypertension: Secondary | ICD-10-CM

## 2013-06-09 DIAGNOSIS — I359 Nonrheumatic aortic valve disorder, unspecified: Secondary | ICD-10-CM

## 2013-06-09 MED ORDER — LISINOPRIL 20 MG PO TABS: 20.0000 mg | ORAL_TABLET | Freq: Every day | ORAL | Status: DC

## 2013-06-09 NOTE — Progress Notes (Signed)
Patient ID: Lucas Curry, male   DOB: Apr 05, 1955, 58 y.o.   MRN: [id number]      SUBJECTIVE: The patient is a 58 year old male with a history of moderate to severe aortic regurgitation and hypertension. His most recent echocardiogram was performed in October 2014 with results noted below. He continues to experience chronic dyspnea with exertion, although this has not progressed in severity. He recently quit smoking. He denies leg swelling. He underwent lumbar fusion in January, and is currently receiving physical therapy. His SBP is normally in the 135-140 range at home. He denies chest pain.    No Known Allergies  Current Outpatient Prescriptions  Medication Sig Dispense Refill  . aspirin 81 MG tablet Take 81 mg by mouth daily.        . furosemide (LASIX) 20 MG tablet Take 20 mg by mouth daily.      Marland Kitchen. lisinopril (PRINIVIL,ZESTRIL) 10 MG tablet Take 10 mg by mouth daily.      Marland Kitchen. NIFEdipine (PROCARDIA-XL/ADALAT CC) 30 MG 24 hr tablet Take 60 mg by mouth daily.      Marland Kitchen. oxycodone-acetaminophen (PERCOCET) 2.5-325 MG per tablet Take 1 tablet by mouth 2 (two) times daily between meals as needed for pain.       No current facility-administered medications for this visit.    Past Medical History  Diagnosis Date  . Aortic valve disorders     moderate to severe aortic insufficiency both by catheterization and echocardiogram.  Ejection fraction 55-60%, low cardiac output.  Cardiac catheterization 1212 calculated valve area 1.14 cm made the abnormal secondary to aortic insufficiency and no significant coronary artery disease.  Normal filling pressures without significant pulmonary hypertension  . Aortic ectasia, unspecified site     Aortic dilatation  . Peripheral vascular disease, unspecified     normal ABIs 2012  . 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  Procedure January 2012  . Palpitations   . Chronic pain due to trauma     patient walks with a limp status post pinning of his right leg  . Limb pain   . Problems with hearing   . ETOH abuse   . Abdominal pain     chronic secondary to scar  . Coronary artery disease (CAD) excluded     Status post cardiac catheterization  . Sciatica     Past Surgical History  Procedure Laterality Date  . Knee and leg orif  1984    TRAUMA  . Right forearm orif  1981    TRAUMA  . Wisdom tooth extraction    . Plastic surgery left eye  1981    TRAUMA    History   Social History  . Marital Status: Divorced    Spouse Name: N/A    Number of Children: 2  . Years of Education: N/A   Occupational History  . PAINTER    Social History Main Topics  . Smoking status: Former Smoker -- 0.80 packs/day for 6 years    Types: Cigarettes    Quit date: 06/02/2013  . Smokeless tobacco: Former NeurosurgeonUser    Quit date: 10/16/1996  . Alcohol Use: Yes  . Drug Use: Not on file  . Sexual Activity: Not on file   Other Topics Concern  . Not on file   Social History Narrative  . No narrative on file     Filed Vitals:  06/09/13 1122  BP: 147/90  Pulse: 91  Height: 5\' 8"  (1.727 m)  Weight: 191 lb (86.637 kg)  SpO2: 100%    PHYSICAL EXAM General: NAD  Neck: No JVD, no thyromegaly or thyroid nodule.  Lungs: Clear to auscultation bilaterally with normal respiratory effort.  CV: Nondisplaced PMI. Heart regular S1/S2, no S3/S4, III/VI apical holosystolic murmur, II/IV diastolic decrescendo murmur at LLSB. No peripheral edema. No carotid bruit. Normal pedal pulses.  Abdomen: Soft, nontender, no hepatosplenomegaly, no distention.  Neurologic: Alert and oriented x 3.  Psych: Normal affect.  Extremities: No clubbing or cyanosis.    ECG: reviewed and available in electronic records.   Echo (12/2012): Study Conclusions  - Left ventricle: The cavity size was normal. Wall thickness was increased in a  pattern of moderate LVH. Systolic function was normal. The estimated ejection fraction was in the range of 55% to 60%. Doppler parameters are consistent with abnormal left ventricular relaxation (grade 1 diastolic dysfunction). - Aortic valve: Mildly calcified annulus. Moderately thickened, moderately calcified leaflets.Trileaflet. Difficult images, the right coronary cusp appears to be heavily calcified with severe restricted movement. Moderate to severeregurgitation. The pressure half time is 385 ms. There is some suggestion of holodiastolic flow reversal in the descending aorta suggestive of more severe disease. Valve area: 2.51cm^2(VTI). Valve area: 2.39cm^2 (Vmax). - Mitral valve: Calcified annulus. Mildly thickened leaflets . Mild regurgitation.      ASSESSMENT AND PLAN: 1. HTN: Elevated today. Will increase lisinopril to 20 mg daily.  2. Aortic regurgitation: Moderate to severe in 12/2012. Will repeat echocardiogram in 6 months. 3. Aortic root dilatation: Root was mildly dilated at the sinotubular junction at 3.9 cm in 12/2012. Repeat in 6 months.  Dispo: f/u 6 months.   Prentice Docker, M.D., F.A.C.C.

## 2013-06-09 NOTE — Patient Instructions (Signed)
   Increase Lisinopril to 20mg  daily - new sent to pharm  Continue all other medications.   Your physician wants you to follow up in: 6 months.  You will receive a reminder letter in the mail one-two months in advance.  If you don't receive a letter, please call our office to schedule the follow up appointment  Your physician has requested that you have an echocardiogram. Echocardiography is a painless test that uses sound waves to create images of your heart. It provides your doctor with information about the size and shape of your heart and how well your heart's chambers and valves are working. This procedure takes approximately one hour. There are no restrictions for this procedure. - due in 6 months just prior to office visit.

## 2013-06-10 ENCOUNTER — Encounter: Payer: Medicaid Other | Admitting: Physical Therapy

## 2013-06-13 ENCOUNTER — Encounter: Payer: Medicaid Other | Admitting: Physical Therapy

## 2013-06-30 ENCOUNTER — Encounter: Payer: Self-pay | Admitting: Cardiovascular Disease

## 2013-12-02 ENCOUNTER — Other Ambulatory Visit: Payer: Self-pay | Admitting: *Deleted

## 2013-12-02 DIAGNOSIS — I351 Nonrheumatic aortic (valve) insufficiency: Secondary | ICD-10-CM

## 2013-12-17 ENCOUNTER — Other Ambulatory Visit: Payer: Medicaid Other

## 2013-12-23 ENCOUNTER — Ambulatory Visit: Payer: Medicaid Other | Admitting: Cardiovascular Disease

## 2014-01-08 ENCOUNTER — Other Ambulatory Visit: Payer: Medicaid Other

## 2014-01-16 ENCOUNTER — Ambulatory Visit: Payer: Medicaid Other | Admitting: Cardiovascular Disease

## 2014-01-21 ENCOUNTER — Other Ambulatory Visit (INDEPENDENT_AMBULATORY_CARE_PROVIDER_SITE_OTHER): Payer: Medicaid Other

## 2014-01-21 ENCOUNTER — Other Ambulatory Visit: Payer: Self-pay

## 2014-01-21 DIAGNOSIS — R0602 Shortness of breath: Secondary | ICD-10-CM

## 2014-01-21 DIAGNOSIS — I351 Nonrheumatic aortic (valve) insufficiency: Secondary | ICD-10-CM

## 2014-01-21 DIAGNOSIS — I77819 Aortic ectasia, unspecified site: Secondary | ICD-10-CM

## 2014-01-29 ENCOUNTER — Telehealth: Payer: Self-pay | Admitting: *Deleted

## 2014-01-29 NOTE — Telephone Encounter (Signed)
-----   Message from Laqueta LindenSuresh A Koneswaran, MD sent at 01/23/2014  8:22 AM EDT ----- Aortic regurgitation is stable since last echo. No changes to management.

## 2014-01-29 NOTE — Telephone Encounter (Signed)
Notes Recorded by Lesle ChrisAngela G Orlanda Frankum, LPN on 16/1/096011/07/2013 at 10:43 AM Patient notified. Already has f/u scheduled 11/9 with Dr. Purvis SheffieldKoneswaran

## 2014-02-02 ENCOUNTER — Ambulatory Visit (INDEPENDENT_AMBULATORY_CARE_PROVIDER_SITE_OTHER): Payer: Medicaid Other | Admitting: Cardiovascular Disease

## 2014-02-02 ENCOUNTER — Encounter: Payer: Self-pay | Admitting: Cardiovascular Disease

## 2014-02-02 VITALS — BP 109/70 | HR 86 | Ht 68.0 in | Wt 194.0 lb

## 2014-02-02 DIAGNOSIS — N528 Other male erectile dysfunction: Secondary | ICD-10-CM

## 2014-02-02 DIAGNOSIS — I10 Essential (primary) hypertension: Secondary | ICD-10-CM | POA: Diagnosis not present

## 2014-02-02 DIAGNOSIS — I77819 Aortic ectasia, unspecified site: Secondary | ICD-10-CM

## 2014-02-02 DIAGNOSIS — I351 Nonrheumatic aortic (valve) insufficiency: Secondary | ICD-10-CM

## 2014-02-02 MED ORDER — TADALAFIL 10 MG PO TABS: 10.0000 mg | ORAL_TABLET | ORAL | Status: DC | PRN

## 2014-02-02 NOTE — Progress Notes (Signed)
Patient ID: Lucas Curry, male   DOB: [birth year], 58 y.o.   MRN: [id number]      SUBJECTIVE: The patient is a 58 year old male with a history of moderate to severe aortic regurgitation and hypertension. His most recent echocardiogram in October 2015 demonstrated normal left ventricular systolic function, EF 60-65%, left ventricular cavity size at the upper limits of normal, mild LVH, grade 1 diastolic dysfunction, moderate to severe aortic regurgitation with a pressure half-time of 436 ms, and mild mitral regurgitation. He gets dyspneic with exertion only after walking 100 yards which is stable. Denies chest pain, dizziness, and leg swelling. Primary complaints are in regards to right leg pain (neuropathy) and erectile dysfunction. Denies leg swelling.   Review of Systems: As per "subjective", otherwise negative.  No Known Allergies  Current Outpatient Prescriptions  Medication Sig Dispense Refill  . aspirin 81 MG tablet Take 81 mg by mouth daily.      . furosemide (LASIX) 20 MG tablet Take 20 mg by mouth daily.    Marland Kitchen. lisinopril (PRINIVIL,ZESTRIL) 20 MG tablet Take 1 tablet (20 mg total) by mouth daily. 30 tablet 6  . oxycodone-acetaminophen (PERCOCET) 2.5-325 MG per tablet Take 1 tablet by mouth 2 (two) times daily between meals as needed for pain.    Marland Kitchen. NIFEdipine (PROCARDIA-XL/ADALAT CC) 30 MG 24 hr tablet Take 60 mg by mouth daily.     No current facility-administered medications for this visit.    Past Medical History  Diagnosis Date  . Aortic valve disorders     moderate to severe aortic insufficiency both by catheterization and echocardiogram.  Ejection fraction 55-60%, low cardiac output.  Cardiac catheterization 1212 calculated valve area 1.14 cm made the abnormal secondary to aortic insufficiency and no significant coronary artery disease.  Normal filling pressures without significant pulmonary hypertension  . Aortic ectasia, unspecified site     Aortic dilatation  .  Peripheral vascular disease, unspecified     normal ABIs 2012  . 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 Procedure January 2012  . Palpitations   . Chronic pain due to trauma     patient walks with a limp status post pinning of his right leg  . Limb pain   . Problems with hearing   . ETOH abuse   . Abdominal pain     chronic secondary to scar  . Coronary artery disease (CAD) excluded     Status post cardiac catheterization  . Sciatica     Past Surgical History  Procedure Laterality Date  . Knee and leg orif  1984    TRAUMA  . Right forearm orif  1981    TRAUMA  . Wisdom tooth extraction    . Plastic surgery left eye  1981    TRAUMA    History   Social History  . Marital Status: Divorced    Spouse Name: N/A    Number of Children: 2  . Years of Education: N/A   Occupational History  . PAINTER    Social History Main Topics  . Smoking status: Current Every Day Smoker -- 1.00 packs/day for 6 years    Types: Cigarettes    Start date: 02/03/2007  . Smokeless tobacco: Former NeurosurgeonUser    Quit date: 10/16/1996  . Alcohol Use: 0.0 oz/week    0 Not specified per week  . Drug Use: No  . Sexual Activity:  Partners: Female   Other Topics Concern  . Not on file   Social History Narrative     Filed Vitals:   02/02/14 1045  BP: 109/70  Pulse: 86  Height: 5\' 8"  (1.727 m)  Weight: 194 lb (87.998 kg)  SpO2: 100%    PHYSICAL EXAM General: NAD  Neck: No JVD, no thyromegaly or thyroid nodule.  Lungs: Clear to auscultation bilaterally with normal respiratory effort.  CV: Nondisplaced PMI. Heart regular S1/S2, no S3/S4, III/VI apical holosystolic murmur, II/IV diastolic decrescendo murmur at LLSB. No peripheral edema. No carotid bruit. Normal pedal pulses.  Abdomen: Soft, nontender, no hepatosplenomegaly, no distention.  Neurologic: Alert  and oriented x 3.  Psych: Normal affect.  Skin: Normal. Musculoskeletal: Normal range of motion, no gross deformities. Extremities: No clubbing or cyanosis.   ECG: Most recent ECG reviewed.  Echo (12/2013): - Left ventricle: The cavity size was at the upper limits of normal. Wall thickness was increased in a pattern of mild LVH. Systolic function was normal. The estimated ejection fraction was in the range of 60% to 65%. Wall motion was normal; there were no regional wall motion abnormalities. Doppler parameters are consistent with abnormal left ventricular relaxation (grade 1 diastolic dysfunction). - Aortic valve: Mildly calcified annulus. Trileaflet; mildly thickened, mildly calcified leaflets. There was moderate to severe regurgitation. Valve area (VTI): 2.13 cm^2. Valve area (Vmax): 1.92 cm^2. Valve area (Vmean): 2.07 cm^2. Regurgitation pressure half-time: 436 ms. - Mitral valve: Mildly calcified annulus. Mildly thickened leaflets . There was mild regurgitation.    ASSESSMENT AND PLAN: 1. Essential HTN: Well controlled on lisinopril 20 mg daily. No changes to therapy. 2. Aortic regurgitation: Moderate to severe in 12/2013. Will repeat echocardiogram in 1 year. 3. Aortic root dilatation: Root was normal by echo in 12/2013 at the sinotubular junction at 3.8 cm. Will continue periodic surveillance. 4. Erectile dysfunction: Will prescribe Cialis prn.  Dispo: f/u 1 year.  Prentice DockerSuresh Jovaun Levene, M.D., F.A.C.C.

## 2014-02-02 NOTE — Patient Instructions (Signed)
   Cialis 10mg  as needed - new sent to pharm Continue all other medications.   Echo - due in 1 year, just prior to next office visit Your physician wants you to follow up in:  1 year.  You will receive a reminder letter in the mail one-two months in advance.  If you don't receive a letter, please call our office to schedule the follow up appointment

## 2014-03-04 ENCOUNTER — Encounter (HOSPITAL_COMMUNITY): Payer: Self-pay | Admitting: Cardiovascular Disease

## 2014-03-21 ENCOUNTER — Other Ambulatory Visit: Payer: Self-pay | Admitting: Cardiovascular Disease

## 2014-03-23 ENCOUNTER — Other Ambulatory Visit: Payer: Self-pay | Admitting: *Deleted

## 2014-07-21 ENCOUNTER — Ambulatory Visit: Payer: Medicaid Other | Admitting: Neurology

## 2014-07-28 ENCOUNTER — Encounter: Payer: Medicaid Other | Admitting: Neurology

## 2014-08-03 ENCOUNTER — Encounter: Payer: Self-pay | Admitting: Neurology

## 2014-10-13 ENCOUNTER — Ambulatory Visit (INDEPENDENT_AMBULATORY_CARE_PROVIDER_SITE_OTHER): Payer: Medicaid Other | Admitting: Neurology

## 2014-10-13 ENCOUNTER — Ambulatory Visit (INDEPENDENT_AMBULATORY_CARE_PROVIDER_SITE_OTHER): Payer: Self-pay | Admitting: Neurology

## 2014-10-13 DIAGNOSIS — Z0289 Encounter for other administrative examinations: Secondary | ICD-10-CM

## 2014-10-13 DIAGNOSIS — R202 Paresthesia of skin: Secondary | ICD-10-CM | POA: Diagnosis not present

## 2014-10-13 NOTE — Progress Notes (Signed)
  [facility]    Provider:  Dr Annalena Piatt Referring Provider: Avon GullyFanta, Tesfaye, MD Primary Care Physician:  Avon GullyFANTA,TESFAYE, MD  HPI:  Lucas Curry is a 59 y.o. male here Lucia Gaskinsas a referral from Dr. Felecia ShellingFanta for pain below the knees. The right leg is worse. There is numbness on the bottom of the both feet but more intense on the right. He has burning on the right lateral lower leg.  Summary  Nerve conduction studies were performed on the right upper and right lower extremities:   The bilateral Peroneal(EDB) motor nerves showed normal conductions with normal F wave latencies The bilateral Tibial(Abd Barnie AldermanHall Brev) motor nerves showed normal conductions with normal F wave latencies The bilateral Sural sensory nerves were within normal limits The bilateral Superficial Peroneal sensory nerves were within normal limits Bilateral H Reflexes showed normal latencies  EMG Needle study was performed on selected right lower extremity muscles:   The Vastus Medialis,  Anterior Tibialis, Medial Gastrocnemius, Peroneus Longus, Extensor Hallucis Longus, Biceps Femoris (short head), Gluteus Maximus and Gluteus Medius were within normal limits. Could not perform emg exam on lumbar paraspinals due to previous surgery. He could not activate the Vastus Medialis and Peroneus Longus due to pain.  Conclusion: This is a normal study. No electrophysiologic evidence for peripheral polyneuropathy, mononeuropathy or radiculopathy. However a small fiber neuropathy could evade detection by this study. Clinical correlation recommended.  Naomie DeanAntonia Captola Teschner, MD  [facility] [address] [place], [place] [address]  Phone [phone] Fax [phone]

## 2014-10-17 NOTE — Progress Notes (Signed)
See procedure note.

## 2014-10-17 NOTE — Procedures (Signed)
[facility]    Provider:  Dr Lucia Gaskins Referring Provider: Avon Gully, MD Primary Care Physician:  Avon Gully, MD  HPI:  Lucas Curry is a 59 y.o. male here as a referral from Dr. Felecia Shelling for pain below the knees. The right leg is worse. There is numbness on the bottom of the both feet but more intense on the right. He has burning on the right lateral lower leg.  Summary  Nerve conduction studies were performed on the right upper and right lower extremities:   The bilateral Peroneal(EDB) motor nerves showed normal conductions with normal F wave latencies The bilateral Tibial(Abd Barnie Alderman) motor nerves showed normal conductions with normal F wave latencies The bilateral Sural sensory nerves were within normal limits The bilateral Superficial Peroneal sensory nerves were within normal limits Bilateral H Reflexes showed normal latencies  EMG Needle study was performed on selected right lower extremity muscles:   The Vastus Medialis,  Anterior Tibialis, Medial Gastrocnemius, Peroneus Longus, Extensor Hallucis Longus, Biceps Femoris (short head), Gluteus Maximus and Gluteus Medius were within normal limits. Could not perform emg exam on lumbar paraspinals due to previous surgery. He could not activate the Vastus Medialis and Peroneus Longus due to pain.  Conclusion: This is a normal study. No electrophysiologic evidence for peripheral large-fiber polyneuropathy, mononeuropathy or radiculopathy. However a small-fiber neuropathy could evade detection by this study. Clinical correlation recommended.  Lucas Dean, MD  [facility] [address] [place], [place] [address]  Phone [phone] Fax [phone]

## 2014-11-20 ENCOUNTER — Other Ambulatory Visit: Payer: Self-pay | Admitting: *Deleted

## 2014-11-20 DIAGNOSIS — I359 Nonrheumatic aortic valve disorder, unspecified: Secondary | ICD-10-CM

## 2015-01-12 ENCOUNTER — Ambulatory Visit (INDEPENDENT_AMBULATORY_CARE_PROVIDER_SITE_OTHER): Payer: Medicaid Other

## 2015-01-12 ENCOUNTER — Other Ambulatory Visit: Payer: Self-pay

## 2015-01-12 ENCOUNTER — Telehealth: Payer: Self-pay | Admitting: *Deleted

## 2015-01-12 DIAGNOSIS — I359 Nonrheumatic aortic valve disorder, unspecified: Secondary | ICD-10-CM

## 2015-01-12 NOTE — Telephone Encounter (Signed)
Notes Recorded by Lesle ChrisAngela G Hill, LPN on 16/10/960410/18/2016 at 2:29 PM Patient notified. Follow up already scheduled for 01/15/2015 with Dr. Pur[place]. Copy fwd to pmd.

## 2015-01-12 NOTE — Telephone Encounter (Signed)
-----   Message from Laqueta LindenSuresh A Koneswaran, MD sent at 01/12/2015  2:18 PM EDT ----- Will discuss at ov. Normal pumping function, mild aortic dilatation, bicuspid AV with moderate leakage.

## 2015-01-15 ENCOUNTER — Ambulatory Visit (INDEPENDENT_AMBULATORY_CARE_PROVIDER_SITE_OTHER): Payer: Medicaid Other | Admitting: Cardiovascular Disease

## 2015-01-15 ENCOUNTER — Encounter: Payer: Self-pay | Admitting: Cardiovascular Disease

## 2015-01-15 VITALS — BP 142/88 | HR 71 | Ht 68.0 in | Wt 197.0 lb

## 2015-01-15 DIAGNOSIS — M25552 Pain in left hip: Secondary | ICD-10-CM

## 2015-01-15 DIAGNOSIS — I77819 Aortic ectasia, unspecified site: Secondary | ICD-10-CM

## 2015-01-15 DIAGNOSIS — Z01818 Encounter for other preprocedural examination: Secondary | ICD-10-CM

## 2015-01-15 DIAGNOSIS — I10 Essential (primary) hypertension: Secondary | ICD-10-CM

## 2015-01-15 DIAGNOSIS — I351 Nonrheumatic aortic (valve) insufficiency: Secondary | ICD-10-CM | POA: Diagnosis not present

## 2015-01-15 DIAGNOSIS — R600 Localized edema: Secondary | ICD-10-CM

## 2015-01-15 NOTE — Patient Instructions (Signed)
Continue all current medications. Your physician wants you to follow up in:  1 year.  You will receive a reminder letter in the mail one-two months in advance.  If you don't receive a letter, please call our office to schedule the follow up appointment   

## 2015-01-15 NOTE — Progress Notes (Signed)
Patient ID: Lucas Curry, male   DOB: 1956/03/07, 59 y.o.   MRN: [id number]      SUBJECTIVE: The patient returns for routine follow-up for a history of aortic regurgitation and hypertension.   His most recent echocardiogram on January 12, 2015 demonstrated normal left ventricular systolic function, EF 60-65%, mild to moderate LVH, grade 1 diastolic dysfunction, moderate aortic regurgitation, mild ascending aortic dilatation, and a functionally bicuspid aortic valve with moderate calcification, with mildly elevated pulmonary pressures, 37 mmHg.   His primary complaint relates to left hip arthritis and needs to undergo total hip replacement. He has had some bilateral leg swelling today but has not taken his Lasix. His systolic blood pressure normally runs between 125-130 but he has not taken his pain medication today. He denies chest pain and shortness of breath as well as dizziness and syncope.    Review of Systems: As per "subjective", otherwise negative.  No Known Allergies  Current Outpatient Prescriptions  Medication Sig Dispense Refill  . aspirin 81 MG tablet Take 81 mg by mouth daily.      . furosemide (LASIX) 20 MG tablet Take 20 mg by mouth daily.    Marland Kitchen lisinopril (PRINIVIL,ZESTRIL) 20 MG tablet TAKE ONE TABLET BY MOUTH ONE TIME DAILY 30 tablet 11  . oxyCODONE-acetaminophen (PERCOCET) 7.5-325 MG tablet Take 1 tablet by mouth every 4 (four) hours as needed for severe pain.    . tadalafil (CIALIS) 10 MG tablet Take 1 tablet (10 mg total) by mouth as needed for erectile dysfunction. 30 tablet 0  . NIFEdipine (PROCARDIA-XL/ADALAT CC) 30 MG 24 hr tablet Take 60 mg by mouth daily.     No current facility-administered medications for this visit.    Past Medical History  Diagnosis Date  . Aortic valve disorders     moderate to severe aortic insufficiency both by catheterization and echocardiogram.  Ejection fraction 55-60%, low cardiac output.  Cardiac catheterization 1212  calculated valve area 1.14 cm made the abnormal secondary to aortic insufficiency and no significant coronary artery disease.  Normal filling pressures without significant pulmonary hypertension  . Aortic ectasia, unspecified site [facility])     Aortic dilatation  . Peripheral vascular disease, unspecified (HCC)     normal ABIs 2012  . 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 Procedure January 2012  . Palpitations   . Chronic pain due to trauma     patient walks with a limp status post pinning of his right leg  . Limb pain   . Problems with hearing   . ETOH abuse   . Abdominal pain     chronic secondary to scar  . Coronary artery disease (CAD) excluded     Status post cardiac catheterization  . Sciatica     Past Surgical History  Procedure Laterality Date  . Knee and leg orif  1984    TRAUMA  . Right forearm orif  1981    TRAUMA  . Wisdom tooth extraction    . Plastic surgery left eye  1981    TRAUMA  . Left and right heart catheterization with coronary angiogram N/A 03/08/2011    Procedure: LEFT AND RIGHT HEART CATHETERIZATION WITH CORONARY ANGIOGRAM;  Surgeon: Iran Ouch, MD;  Location: [facility] CATH LAB;  Service: Cardiovascular;  Laterality: N/A;    Social History   Social History  . Marital Status: Divorced  Spouse Name: N/A  . Number of Children: 2  . Years of Education: N/A   Occupational History  . PAINTER    Social History Main Topics  . Smoking status: Current Every Day Smoker -- 1.00 packs/day for 6 years    Types: Cigarettes    Start date: 02/03/2007  . Smokeless tobacco: Former NeurosurgeonUser    Quit date: 10/16/1996  . Alcohol Use: 0.0 oz/week    0 Standard drinks or equivalent per week  . Drug Use: No  . Sexual Activity:    Partners: Female   Other Topics Concern  . Not on file   Social History Narrative     Filed Vitals:    01/15/15 1008  BP: 142/88  Pulse: 71  Height: 5\' 8"  (1.727 m)  Weight: 197 lb (89.359 kg)  SpO2: 99%    PHYSICAL EXAM General: NAD  Neck: No JVD, no thyromegaly or thyroid nodule.  Lungs: Clear to auscultation bilaterally with normal respiratory effort.  CV: Nondisplaced PMI. Heart regular S1/S2, no S3/S4, III/VI apical holosystolic murmur, II/IV diastolic decrescendo murmur at LLSB. 1+ pitting pretibial edema b/l. No carotid bruit.  Abdomen: Soft, no distention.  Neurologic: Alert and oriented x 3.  Psych: Normal affect.  Skin: Normal. Musculoskeletal: No gross deformities. Extremities: No clubbing or cyanosis.   ECG: Most recent ECG reviewed.      ASSESSMENT AND PLAN: 1. Essential HTN: Mildly elevated on lisinopril 20 mg daily but usually normal and has not taken pain meds today. No changes to therapy.  2. Aortic regurgitation: Moderate as noted above. Will periodically monitor with a repeat echocardiogram in one year.  3. Ascending aortic root dilatation: Mild as noted above. Will continue periodic surveillance.  4. Preoperative risk stratification: He is at an acceptable cardiovascular risk which is not prohibitive to proceed with left total hip replacement. Optimal BP control in the perioperative period is important.  Dispo: f/u 1 year with echocardiogram beforehand.   Prentice DockerSuresh Koneswaran, M.D., F.A.C.C.

## 2015-01-28 ENCOUNTER — Other Ambulatory Visit: Payer: Medicaid Other

## 2015-02-03 ENCOUNTER — Ambulatory Visit: Payer: Medicaid Other | Admitting: Cardiovascular Disease

## 2015-02-15 ENCOUNTER — Encounter (HOSPITAL_COMMUNITY): Payer: Self-pay

## 2015-02-15 ENCOUNTER — Encounter (HOSPITAL_COMMUNITY)
Admission: RE | Admit: 2015-02-15 | Discharge: 2015-02-15 | Disposition: A | Discharge status: Home / Self Care | Payer: Medicaid Other | Source: Ambulatory Visit | Attending: Orthopaedic Surgery | Admitting: Orthopaedic Surgery

## 2015-02-15 ENCOUNTER — Ambulatory Visit (HOSPITAL_COMMUNITY)
Admission: RE | Admit: 2015-02-15 | Discharge: 2015-02-15 | Disposition: A | Discharge status: Home / Self Care | Payer: Medicaid Other | Source: Ambulatory Visit | Attending: Orthopedic Surgery | Admitting: Orthopedic Surgery

## 2015-02-15 DIAGNOSIS — Z01818 Encounter for other preprocedural examination: Secondary | ICD-10-CM | POA: Insufficient documentation

## 2015-02-15 DIAGNOSIS — Z01812 Encounter for preprocedural laboratory examination: Secondary | ICD-10-CM | POA: Insufficient documentation

## 2015-02-15 HISTORY — DX: Cardiac murmur, unspecified: R01.1

## 2015-02-15 HISTORY — DX: Essential (primary) hypertension: I10

## 2015-02-15 LAB — COMPREHENSIVE METABOLIC PANEL
ALK PHOS: 90 U/L (ref 38–126)
ALT: 46 U/L (ref 17–63)
ANION GAP: 7 (ref 5–15)
AST: 38 U/L (ref 15–41)
Albumin: 3.9 g/dL (ref 3.5–5.0)
BUN: 15 mg/dL (ref 6–20)
CALCIUM: 9.6 mg/dL (ref 8.9–10.3)
CO2: 28 mmol/L (ref 22–32)
Chloride: 101 mmol/L (ref 101–111)
Creatinine, Ser: 0.99 mg/dL (ref 0.61–1.24)
GFR calc non Af Amer: >60 mL/min (ref >60)
Glucose, Bld: 96 mg/dL (ref 65–99)
Potassium: 4.3 mmol/L (ref 3.5–5.1)
SODIUM: 136 mmol/L (ref 135–145)
TOTAL PROTEIN: 7.6 g/dL (ref 6.5–8.1)
Total Bilirubin: 0.2 mg/dL — ABNORMAL LOW (ref 0.3–1.2)

## 2015-02-15 LAB — CBC WITH DIFFERENTIAL/PLATELET
Basophils Absolute: 0 10*3/uL (ref 0.0–0.1)
Basophils Relative: 0 %
EOS ABS: 0.3 10*3/uL (ref 0.0–0.7)
EOS PCT: 3 %
HCT: 42.9 % (ref 39.0–52.0)
HEMOGLOBIN: 14.6 g/dL (ref 13.0–17.0)
LYMPHS ABS: 3.9 10*3/uL (ref 0.7–4.0)
Lymphocytes Relative: 46 %
MCH: 31.6 pg (ref 26.0–34.0)
MCHC: 34 g/dL (ref 30.0–36.0)
MCV: 92.9 fL (ref 78.0–100.0)
MONO ABS: 0.6 10*3/uL (ref 0.1–1.0)
MONOS PCT: 7 %
Neutro Abs: 3.8 10*3/uL (ref 1.7–7.7)
Neutrophils Relative %: 44 %
Platelets: 192 10*3/uL (ref 150–400)
RBC: 4.62 MIL/uL (ref 4.22–5.81)
RDW: 13.4 % (ref 11.5–15.5)
WBC: 8.5 10*3/uL (ref 4.0–10.5)

## 2015-02-15 LAB — URINALYSIS, ROUTINE W REFLEX MICROSCOPIC
Bilirubin Urine: NEGATIVE
GLUCOSE, UA: NEGATIVE mg/dL
HGB URINE DIPSTICK: NEGATIVE
KETONES UR: NEGATIVE mg/dL
Leukocytes, UA: NEGATIVE
Nitrite: NEGATIVE
PH: 7 (ref 5.0–8.0)
PROTEIN: NEGATIVE mg/dL
SPECIFIC GRAVITY, URINE: 1.021 (ref 1.005–1.030)

## 2015-02-15 LAB — SURGICAL PCR SCREEN
MRSA, PCR: NEGATIVE
Staphylococcus aureus: POSITIVE — AB

## 2015-02-15 LAB — TYPE AND SCREEN
ABO/RH(D): B POS
Antibody Screen: NEGATIVE

## 2015-02-15 LAB — PROTIME-INR
INR: 1.05 (ref 0.00–1.49)
Prothrombin Time: 13.9 seconds (ref 11.6–15.2)

## 2015-02-15 LAB — APTT: aPTT: 33 seconds (ref 24–37)

## 2015-02-15 LAB — ABO/RH: ABO/RH(D): B POS

## 2015-02-15 NOTE — Progress Notes (Signed)
I called a prescription for Mupirocin ointment to [facility], [address], [place] , [place]

## 2015-02-15 NOTE — Pre-Procedure Instructions (Signed)
Lucas Curry  02/15/2015      Your procedure is scheduled on November 29.  Report to [facility] Admitting at 5:30 A.M.  Call this number if you have problems the morning of surgery:  [phone]   Remember:  Do not eat food or drink liquids after midnight.  Take these medicines the morning of surgery with A SIP OF WATER Procardia, Oxycodone (if needed)   STOP Aspirin today   STOP/ Do not take Aspirin, Aleve, Naproxen, Advil, Ibuprofen, Motrin, Vitamins, Herbs, or Supplements starting today   Do not wear jewelry, make-up or nail polish.  Do not wear lotions, powders, or perfumes.  You may wear deodorant.  Do not shave 48 hours prior to surgery.  Men may shave face and neck.  Do not bring valuables to the hospital.  [facility] is not responsible for any belongings or valuables.  Contacts, dentures or bridgework may not be worn into surgery.  Leave your suitcase in the car.  After surgery it may be brought to your room.  For patients admitted to the hospital, discharge time will be determined by your treatment team.  Patients discharged the day of surgery will not be allowed to drive home.   [facility] - Preparing for Surgery  Before surgery, you can play an important role.  Because skin is not sterile, your skin needs to be as free of germs as possible.  You can reduce the number of germs on you skin by washing with CHG (chlorahexidine gluconate) soap before surgery.  CHG is an antiseptic cleaner which kills germs and bonds with the skin to continue killing germs even after washing.  Please DO NOT use if you have an allergy to CHG or antibacterial soaps.  If your skin becomes reddened/irritated stop using the CHG and inform your nurse when you arrive at Short Stay.  Do not shave (including legs and underarms) for at least 48 hours prior to the first CHG shower.  You may shave your face.  Please follow these instructions carefully:   1.  Shower with CHG  Soap the night before surgery and the morning of Surgery.  2.  If you choose to wash your hair, wash your hair first as usual with your normal shampoo.  3.  After you shampoo, rinse your hair and body thoroughly to remove the shampoo.  4.  Use CHG as you would any other liquid soap.  You can apply CHG directly to the skin and wash gently with scrungie or a clean washcloth.  5.  Apply the CHG Soap to your body ONLY FROM THE NECK DOWN.  Do not use on open wounds or open sores.  Avoid contact with your eyes, ears, mouth and genitals (private parts).  Wash genitals (private parts) with your normal soap.  6.  Wash thoroughly, paying special attention to the area where your surgery will be performed.  7.  Thoroughly rinse your body with warm water from the neck down.  8.  DO NOT shower/wash with your normal soap after using and rinsing off the CHG Soap.  9.  Pat yourself dry with a clean towel.            10.  Wear clean pajamas.            11.  Place clean sheets on your bed the night of your first shower and do not sleep with pets.  Day of Surgery  Do not apply any lotions  the morning of surgery.  Please wear clean clothes to the hospital/surgery center.   Please read over the following fact sheets that you were given. Pain Booklet, Coughing and Deep Breathing, Blood Transfusion Information, Total Joint Packet and Surgical Site Infection Prevention

## 2015-02-15 NOTE — Progress Notes (Signed)
Patient denies chest pain, shob, PCP Dr. Berniece AndreasFenta, Cardiologist Dr. Purvis SheffieldKoneswaran last OV/ clearence noted in Epic. Echo on 01/12/15 noted in Epic.

## 2015-02-16 LAB — URINE CULTURE: Culture: 1000

## 2015-02-22 MED ORDER — CEFAZOLIN SODIUM-DEXTROSE 2-3 GM-% IV SOLR
2.0000 g | INTRAVENOUS | Status: AC
  Administered 2015-02-23: 2 g via INTRAVENOUS
  Filled 2015-02-22: qty 50

## 2015-02-22 MED ORDER — SODIUM CHLORIDE 0.9 % IV SOLN: INTRAVENOUS | Status: DC

## 2015-02-22 MED ORDER — ACETAMINOPHEN 10 MG/ML IV SOLN
1000.0000 mg | Freq: Once | INTRAVENOUS | Status: DC
  Filled 2015-02-22: qty 100

## 2015-02-22 MED ORDER — TRANEXAMIC ACID 1000 MG/10ML IV SOLN
2000.0000 mg | INTRAVENOUS | Status: DC
  Filled 2015-02-22: qty 20

## 2015-02-22 MED ORDER — CHLORHEXIDINE GLUCONATE 4 % EX LIQD: 60.0000 mL | Freq: Once | CUTANEOUS | Status: DC
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
## 2015-02-23 ENCOUNTER — Inpatient Hospital Stay (HOSPITAL_COMMUNITY): Payer: Medicaid Other | Admitting: Anesthesiology

## 2015-02-23 ENCOUNTER — Encounter (HOSPITAL_COMMUNITY)
Admission: RE | Disposition: A | Discharge status: Home / Self Care | Payer: Self-pay | Source: Ambulatory Visit | Attending: Orthopaedic Surgery

## 2015-02-23 ENCOUNTER — Encounter (HOSPITAL_COMMUNITY): Payer: Self-pay | Admitting: *Deleted

## 2015-02-23 ENCOUNTER — Inpatient Hospital Stay (HOSPITAL_COMMUNITY): Payer: Medicaid Other

## 2015-02-23 ENCOUNTER — Inpatient Hospital Stay (HOSPITAL_COMMUNITY)
Admission: RE | Admit: 2015-02-23 | Discharge: 2015-02-25 | DRG: 470 | Disposition: A | Discharge status: Home / Self Care | Payer: Medicaid Other | Source: Ambulatory Visit | Attending: Orthopaedic Surgery | Admitting: Orthopaedic Surgery

## 2015-02-23 DIAGNOSIS — I739 Peripheral vascular disease, unspecified: Secondary | ICD-10-CM | POA: Diagnosis present

## 2015-02-23 DIAGNOSIS — M879 Osteonecrosis, unspecified: Secondary | ICD-10-CM | POA: Diagnosis present

## 2015-02-23 DIAGNOSIS — D62 Acute posthemorrhagic anemia: Secondary | ICD-10-CM | POA: Diagnosis not present

## 2015-02-23 DIAGNOSIS — M1612 Unilateral primary osteoarthritis, left hip: Secondary | ICD-10-CM | POA: Diagnosis present

## 2015-02-23 DIAGNOSIS — I351 Nonrheumatic aortic (valve) insufficiency: Secondary | ICD-10-CM | POA: Diagnosis present

## 2015-02-23 DIAGNOSIS — I10 Essential (primary) hypertension: Secondary | ICD-10-CM | POA: Diagnosis present

## 2015-02-23 DIAGNOSIS — M87052 Idiopathic aseptic necrosis of left femur: Secondary | ICD-10-CM | POA: Diagnosis present

## 2015-02-23 DIAGNOSIS — M25552 Pain in left hip: Secondary | ICD-10-CM | POA: Diagnosis present

## 2015-02-23 DIAGNOSIS — G8921 Chronic pain due to trauma: Secondary | ICD-10-CM | POA: Diagnosis present

## 2015-02-23 DIAGNOSIS — F17210 Nicotine dependence, cigarettes, uncomplicated: Secondary | ICD-10-CM | POA: Diagnosis present

## 2015-02-23 DIAGNOSIS — Z9889 Other specified postprocedural states: Secondary | ICD-10-CM

## 2015-02-23 DIAGNOSIS — Z96649 Presence of unspecified artificial hip joint: Secondary | ICD-10-CM

## 2015-02-23 HISTORY — PX: TOTAL HIP ARTHROPLASTY: SHX124

## 2015-02-23 SURGERY — ARTHROPLASTY, HIP, TOTAL,POSTERIOR APPROACH
Anesthesia: General | Site: Hip | Laterality: Left

## 2015-02-23 MED ORDER — LISINOPRIL 20 MG PO TABS
20.0000 mg | ORAL_TABLET | Freq: Every day | ORAL | Status: DC
  Administered 2015-02-23 – 2015-02-25 (×3): 20 mg via ORAL
  Filled 2015-02-23 (×3): qty 1

## 2015-02-23 MED ORDER — BISACODYL 10 MG RE SUPP: 10.0000 mg | Freq: Every day | RECTAL | Status: DC | PRN

## 2015-02-23 MED ORDER — HYDROMORPHONE HCL 1 MG/ML IJ SOLN
INTRAMUSCULAR | Status: AC
  Filled 2015-02-23: qty 1

## 2015-02-23 MED ORDER — ACETAMINOPHEN 10 MG/ML IV SOLN
1000.0000 mg | Freq: Four times a day (QID) | INTRAVENOUS | Status: AC
  Administered 2015-02-23 – 2015-02-24 (×4): 1000 mg via INTRAVENOUS
  Filled 2015-02-23 (×5): qty 100

## 2015-02-23 MED ORDER — SODIUM CHLORIDE 0.9 % IJ SOLN
INTRAMUSCULAR | Status: AC
  Filled 2015-02-23: qty 10

## 2015-02-23 MED ORDER — SUFENTANIL 5MCG/ML (10ML) SYRINGE FOR MEDFUSION PUMP - OPTIME
INTRAVENOUS | Status: DC | PRN
  Administered 2015-02-23 (×2): 10 ug via INTRAVENOUS
  Administered 2015-02-23: 5 ug via INTRAVENOUS
  Administered 2015-02-23 (×3): 10 ug via INTRAVENOUS
  Administered 2015-02-23: 5 ug via INTRAVENOUS
  Administered 2015-02-23: 10 ug via INTRAVENOUS

## 2015-02-23 MED ORDER — OXYCODONE HCL 5 MG PO TABS
5.0000 mg | ORAL_TABLET | ORAL | Status: DC | PRN
  Administered 2015-02-23 – 2015-02-25 (×12): 10 mg via ORAL
  Filled 2015-02-23 (×13): qty 2

## 2015-02-23 MED ORDER — HYDROMORPHONE HCL 1 MG/ML IJ SOLN
0.2500 mg | INTRAMUSCULAR | Status: DC | PRN
  Administered 2015-02-23 (×5): 0.5 mg via INTRAVENOUS

## 2015-02-23 MED ORDER — PROPOFOL 10 MG/ML IV BOLUS
INTRAVENOUS | Status: AC
  Filled 2015-02-23: qty 20

## 2015-02-23 MED ORDER — SUFENTANIL CITRATE 50 MCG/ML IV SOLN
INTRAVENOUS | Status: AC
  Filled 2015-02-23: qty 1

## 2015-02-23 MED ORDER — ONDANSETRON HCL 4 MG/2ML IJ SOLN: 4.0000 mg | Freq: Four times a day (QID) | INTRAMUSCULAR | Status: DC | PRN

## 2015-02-23 MED ORDER — ROCURONIUM BROMIDE 50 MG/5ML IV SOLN
INTRAVENOUS | Status: AC
  Filled 2015-02-23: qty 1

## 2015-02-23 MED ORDER — 0.9 % SODIUM CHLORIDE (POUR BTL) OPTIME
TOPICAL | Status: DC | PRN
  Administered 2015-02-23: 1000 mL

## 2015-02-23 MED ORDER — LIDOCAINE HCL (CARDIAC) 20 MG/ML IV SOLN
INTRAVENOUS | Status: DC | PRN
  Administered 2015-02-23: 60 mg via INTRAVENOUS

## 2015-02-23 MED ORDER — RIVAROXABAN 10 MG PO TABS
10.0000 mg | ORAL_TABLET | Freq: Every day | ORAL | Status: DC
  Administered 2015-02-24 – 2015-02-25 (×2): 10 mg via ORAL
  Filled 2015-02-23 (×2): qty 1

## 2015-02-23 MED ORDER — HYDROMORPHONE HCL 1 MG/ML IJ SOLN
INTRAMUSCULAR | Status: AC
  Administered 2015-02-23: 0.5 mg via INTRAVENOUS
  Filled 2015-02-23: qty 1

## 2015-02-23 MED ORDER — DIPHENHYDRAMINE HCL 12.5 MG/5ML PO ELIX: 12.5000 mg | ORAL_SOLUTION | ORAL | Status: DC | PRN

## 2015-02-23 MED ORDER — METOCLOPRAMIDE HCL 5 MG/ML IJ SOLN: 5.0000 mg | Freq: Three times a day (TID) | INTRAMUSCULAR | Status: DC | PRN

## 2015-02-23 MED ORDER — EPHEDRINE SULFATE 50 MG/ML IJ SOLN
INTRAMUSCULAR | Status: AC
  Filled 2015-02-23: qty 1

## 2015-02-23 MED ORDER — METHOCARBAMOL 500 MG PO TABS
500.0000 mg | ORAL_TABLET | Freq: Four times a day (QID) | ORAL | Status: DC | PRN
  Administered 2015-02-23 – 2015-02-25 (×4): 500 mg via ORAL
  Filled 2015-02-23 (×4): qty 1

## 2015-02-23 MED ORDER — PROPOFOL 10 MG/ML IV BOLUS
INTRAVENOUS | Status: DC | PRN
  Administered 2015-02-23: 160 mg via INTRAVENOUS

## 2015-02-23 MED ORDER — POLYETHYLENE GLYCOL 3350 17 G PO PACK
17.0000 g | PACK | Freq: Every day | ORAL | Status: DC | PRN
Start: 2015-02-23 — End: 2015-02-25

## 2015-02-23 MED ORDER — MIDAZOLAM HCL 5 MG/5ML IJ SOLN
INTRAMUSCULAR | Status: DC | PRN
  Administered 2015-02-23: 1 mg via INTRAVENOUS

## 2015-02-23 MED ORDER — METHOCARBAMOL 1000 MG/10ML IJ SOLN: 500.0000 mg | Freq: Four times a day (QID) | INTRAVENOUS | Status: DC | PRN

## 2015-02-23 MED ORDER — ONDANSETRON HCL 4 MG/2ML IJ SOLN
INTRAMUSCULAR | Status: DC | PRN
  Administered 2015-02-23: 4 mg via INTRAVENOUS

## 2015-02-23 MED ORDER — SODIUM CHLORIDE 0.9 % IV SOLN
75.0000 mL/h | INTRAVENOUS | Status: DC
  Administered 2015-02-23: 75 mL/h via INTRAVENOUS

## 2015-02-23 MED ORDER — HYDROMORPHONE HCL 1 MG/ML IJ SOLN
0.5000 mg | INTRAMUSCULAR | Status: DC | PRN
  Administered 2015-02-23 – 2015-02-24 (×3): 1 mg via INTRAVENOUS
  Filled 2015-02-23 (×3): qty 1

## 2015-02-23 MED ORDER — SUGAMMADEX SODIUM 200 MG/2ML IV SOLN
INTRAVENOUS | Status: AC
  Filled 2015-02-23: qty 2

## 2015-02-23 MED ORDER — PROMETHAZINE HCL 25 MG/ML IJ SOLN: 6.2500 mg | INTRAMUSCULAR | Status: DC | PRN

## 2015-02-23 MED ORDER — KETOROLAC TROMETHAMINE 15 MG/ML IJ SOLN
15.0000 mg | Freq: Four times a day (QID) | INTRAMUSCULAR | Status: AC
  Administered 2015-02-23 – 2015-02-24 (×4): 15 mg via INTRAVENOUS
  Filled 2015-02-23 (×4): qty 1

## 2015-02-23 MED ORDER — PHENOL 1.4 % MT LIQD: 1.0000 | OROMUCOSAL | Status: DC | PRN

## 2015-02-23 MED ORDER — MAGNESIUM CITRATE PO SOLN: 1.0000 | Freq: Once | ORAL | Status: DC | PRN

## 2015-02-23 MED ORDER — ALUM & MAG HYDROXIDE-SIMETH 200-200-20 MG/5ML PO SUSP: 30.0000 mL | ORAL | Status: DC | PRN

## 2015-02-23 MED ORDER — BUPIVACAINE-EPINEPHRINE (PF) 0.25% -1:200000 IJ SOLN
INTRAMUSCULAR | Status: DC | PRN
  Administered 2015-02-23: 30 mL via PERINEURAL

## 2015-02-23 MED ORDER — ARTIFICIAL TEARS OP OINT
TOPICAL_OINTMENT | OPHTHALMIC | Status: AC
  Filled 2015-02-23: qty 3.5

## 2015-02-23 MED ORDER — METHOCARBAMOL 1000 MG/10ML IJ SOLN
500.0000 mg | INTRAVENOUS | Status: AC
  Administered 2015-02-23: 500 mg via INTRAVENOUS
  Filled 2015-02-23: qty 5

## 2015-02-23 MED ORDER — ALBUMIN HUMAN 5 % IV SOLN
INTRAVENOUS | Status: DC | PRN
  Administered 2015-02-23: 09:00:00 via INTRAVENOUS

## 2015-02-23 MED ORDER — SUCCINYLCHOLINE CHLORIDE 20 MG/ML IJ SOLN
INTRAMUSCULAR | Status: AC
  Filled 2015-02-23: qty 1

## 2015-02-23 MED ORDER — ONDANSETRON HCL 4 MG PO TABS: 4.0000 mg | ORAL_TABLET | Freq: Four times a day (QID) | ORAL | Status: DC | PRN

## 2015-02-23 MED ORDER — DOCUSATE SODIUM 100 MG PO CAPS
100.0000 mg | ORAL_CAPSULE | Freq: Two times a day (BID) | ORAL | Status: DC
Start: 2015-02-23 — End: 2015-02-25
  Administered 2015-02-23 – 2015-02-25 (×5): 100 mg via ORAL
  Filled 2015-02-23 (×5): qty 1

## 2015-02-23 MED ORDER — LIDOCAINE HCL (CARDIAC) 20 MG/ML IV SOLN
INTRAVENOUS | Status: AC
  Filled 2015-02-23: qty 5

## 2015-02-23 MED ORDER — BUPIVACAINE-EPINEPHRINE (PF) 0.25% -1:200000 IJ SOLN
INTRAMUSCULAR | Status: AC
  Filled 2015-02-23: qty 30

## 2015-02-23 MED ORDER — ONDANSETRON HCL 4 MG/2ML IJ SOLN
INTRAMUSCULAR | Status: AC
  Filled 2015-02-23: qty 2

## 2015-02-23 MED ORDER — ROCURONIUM BROMIDE 100 MG/10ML IV SOLN
INTRAVENOUS | Status: DC | PRN
  Administered 2015-02-23: 50 mg via INTRAVENOUS

## 2015-02-23 MED ORDER — MENTHOL 3 MG MT LOZG: 1.0000 | LOZENGE | OROMUCOSAL | Status: DC | PRN

## 2015-02-23 MED ORDER — CEFAZOLIN SODIUM-DEXTROSE 2-3 GM-% IV SOLR
2.0000 g | Freq: Four times a day (QID) | INTRAVENOUS | Status: AC
  Administered 2015-02-23 (×2): 2 g via INTRAVENOUS
  Filled 2015-02-23 (×3): qty 50

## 2015-02-23 MED ORDER — SUGAMMADEX SODIUM 200 MG/2ML IV SOLN
INTRAVENOUS | Status: DC | PRN
  Administered 2015-02-23: 200 mg via INTRAVENOUS

## 2015-02-23 MED ORDER — MIDAZOLAM HCL 2 MG/2ML IJ SOLN
INTRAMUSCULAR | Status: AC
  Filled 2015-02-23: qty 2

## 2015-02-23 MED ORDER — FUROSEMIDE 20 MG PO TABS
10.0000 mg | ORAL_TABLET | Freq: Two times a day (BID) | ORAL | Status: DC
Start: 2015-02-23 — End: 2015-02-25
  Administered 2015-02-24 – 2015-02-25 (×3): 10 mg via ORAL
  Filled 2015-02-23 (×3): qty 1

## 2015-02-23 MED ORDER — LACTATED RINGERS IV SOLN
INTRAVENOUS | Status: DC | PRN
  Administered 2015-02-23 (×2): via INTRAVENOUS

## 2015-02-23 MED ORDER — METOCLOPRAMIDE HCL 5 MG PO TABS: 5.0000 mg | ORAL_TABLET | Freq: Three times a day (TID) | ORAL | Status: DC | PRN

## 2015-02-23 SURGICAL SUPPLY — 59 items
BAG DECANTER FOR FLEXI CONT (MISCELLANEOUS) ×3 IMPLANT
BLADE SAW SAG 73X25 THK (BLADE) ×2
BLADE SAW SGTL 73X25 THK (BLADE) ×1 IMPLANT
BRUSH FEMORAL CANAL (MISCELLANEOUS) IMPLANT
CAPT HIP TOTAL 2 ×3 IMPLANT
COVER SURGICAL LIGHT HANDLE (MISCELLANEOUS) ×3 IMPLANT
DRAPE INCISE IOBAN 66X45 STRL (DRAPES) ×3 IMPLANT
DRAPE ORTHO SPLIT 77X108 STRL (DRAPES) ×4
DRAPE SURG ORHT 6 SPLT 77X108 (DRAPES) ×2 IMPLANT
DRSG MEPILEX BORDER 4X12 (GAUZE/BANDAGES/DRESSINGS) ×3 IMPLANT
DURAPREP 26ML APPLICATOR (WOUND CARE) ×6 IMPLANT
ELECT BLADE 4.0 EZ CLEAN MEGAD (MISCELLANEOUS) ×3
ELECT BLADE 6.5 EXT (BLADE) IMPLANT
ELECT REM PT RETURN 9FT ADLT (ELECTROSURGICAL) ×3
ELECTRODE BLDE 4.0 EZ CLN MEGD (MISCELLANEOUS) ×1 IMPLANT
ELECTRODE REM PT RTRN 9FT ADLT (ELECTROSURGICAL) ×1 IMPLANT
EVACUATOR 1/8 PVC DRAIN (DRAIN) IMPLANT
FACESHIELD WRAPAROUND (MASK) ×6 IMPLANT
GLOVE BIOGEL PI IND STRL 8 (GLOVE) ×2 IMPLANT
GLOVE BIOGEL PI IND STRL 8.5 (GLOVE) ×1 IMPLANT
GLOVE BIOGEL PI INDICATOR 8 (GLOVE) ×4
GLOVE BIOGEL PI INDICATOR 8.5 (GLOVE) ×2
GLOVE ECLIPSE 8.0 STRL XLNG CF (GLOVE) ×6 IMPLANT
GLOVE SURG ORTHO 8.5 STRL (GLOVE) ×6 IMPLANT
GOWN STRL REUS W/ TWL LRG LVL3 (GOWN DISPOSABLE) ×2 IMPLANT
GOWN STRL REUS W/TWL 2XL LVL3 (GOWN DISPOSABLE) ×6 IMPLANT
GOWN STRL REUS W/TWL LRG LVL3 (GOWN DISPOSABLE) ×4
HANDPIECE INTERPULSE COAX TIP (DISPOSABLE) ×2
IMMOBILIZER KNEE 20 (SOFTGOODS) IMPLANT
IMMOBILIZER KNEE 22 UNIV (SOFTGOODS) ×3 IMPLANT
KIT BASIN OR (CUSTOM PROCEDURE TRAY) ×3 IMPLANT
KIT ROOM TURNOVER OR (KITS) ×3 IMPLANT
MANIFOLD NEPTUNE II (INSTRUMENTS) ×3 IMPLANT
NEEDLE 22X1 1/2 (OR ONLY) (NEEDLE) ×3 IMPLANT
NS IRRIG 1000ML POUR BTL (IV SOLUTION) ×6 IMPLANT
PACK TOTAL JOINT (CUSTOM PROCEDURE TRAY) ×3 IMPLANT
PACK UNIVERSAL I (CUSTOM PROCEDURE TRAY) ×3 IMPLANT
PAD ARMBOARD 7.5X6 YLW CONV (MISCELLANEOUS) ×6 IMPLANT
PRESSURIZER FEMORAL UNIV (MISCELLANEOUS) IMPLANT
SET HNDPC FAN SPRY TIP SCT (DISPOSABLE) ×1 IMPLANT
SPONGE LAP 18X18 X RAY DECT (DISPOSABLE) ×3 IMPLANT
STAPLER VISISTAT 35W (STAPLE) IMPLANT
SUCTION FRAZIER TIP 10 FR DISP (SUCTIONS) ×3 IMPLANT
SUT BONE WAX W31G (SUTURE) IMPLANT
SUT ETHIBOND NAB CT1 #1 30IN (SUTURE) ×12 IMPLANT
SUT MNCRL AB 3-0 PS2 18 (SUTURE) ×3 IMPLANT
SUT VIC AB 0 CT1 27 (SUTURE) ×2
SUT VIC AB 0 CT1 27XBRD ANBCTR (SUTURE) ×1 IMPLANT
SUT VIC AB 1 CT1 27 (SUTURE) ×2
SUT VIC AB 1 CT1 27XBRD ANBCTR (SUTURE) ×1 IMPLANT
SUT VIC AB 2-0 CT1 27 (SUTURE) ×2
SUT VIC AB 2-0 CT1 TAPERPNT 27 (SUTURE) ×1 IMPLANT
SYR CONTROL 10ML LL (SYRINGE) ×3 IMPLANT
TOWEL OR 17X24 6PK STRL BLUE (TOWEL DISPOSABLE) ×3 IMPLANT
TOWEL OR 17X26 10 PK STRL BLUE (TOWEL DISPOSABLE) ×3 IMPLANT
TOWER CARTRIDGE SMART MIX (DISPOSABLE) IMPLANT
WATER STERILE IRR 1000ML POUR (IV SOLUTION) IMPLANT
WRAP KNEE MAXI GEL POST OP (GAUZE/BANDAGES/DRESSINGS) ×3 IMPLANT
YANKAUER SUCT BULB TIP NO VENT (SUCTIONS) ×3 IMPLANT

## 2015-02-23 NOTE — Transfer of Care (Signed)
Immediate Anesthesia Transfer of Care Note  Patient: Lucas Curry  Procedure(s) Performed: Procedure(s): TOTAL HIP ARTHROPLASTY (Left)  Patient Location: PACU  Anesthesia Type:General  Level of Consciousness: awake, oriented and sedated  Airway & Oxygen Therapy: Patient Spontanous Breathing and Patient connected to face mask oxygen  Post-op Assessment: Report given to RN, Post -op Vital signs reviewed and stable and Patient moving all extremities  Post vital signs: Reviewed and stable  Last Vitals:  Filed Vitals:   02/23/15 0618 02/23/15 0953  BP: 132/56 180/99  Pulse: 73 99  Temp: 36.6 C   Resp: 20     Complications: No apparent anesthesia complications

## 2015-02-23 NOTE — Op Note (Signed)
PATIENT ID:      Micheal LikensMarshall R Bogen  MRN:     [id number] DOB/AGE:    November 23, 1955 / 59 y.o.       OPERATIVE REPORT    DATE OF PROCEDURE:  02/23/2015       PREOPERATIVE DIAGNOSIS:end stage   LEFT HIP OSTEOARTHRITIS                                                       Estimated body mass index is 29.35 kg/(m^2) as calculated from the following:   Height as of this encounter: 5\' 8"  (1.727 m).   Weight as of this encounter: 87.544 kg (193 lb).     POSTOPERATIVE DIAGNOSIS:   LEFT HIP OSTEOARTHRITIS -same                                                                    Estimated body mass index is 29.35 kg/(m^2) as calculated from the following:   Height as of this encounter: 5\' 8"  (1.727 m).   Weight as of this encounter: 87.544 kg (193 lb).     PROCEDURE:  Procedure(s):LEFT TOTAL HIP ARTHROPLASTY      SURGEON:  Norlene CampbellPeter Dareth Andrew, MD    ASSISTANT:   Jacqualine CodeBrian Petrarca, PA-C   (Present and scrubbed throughout the case, critical for assistance with exposure, retraction, instrumentation, and closure.)          ANESTHESIA: general     DRAINS: none :      TOURNIQUET TIME: * No tourniquets in log *    COMPLICATIONS:  None   CONDITION:  stable  PROCEDURE IN DETAIL: [id number]   Elmarie Devlin W 02/23/2015, 9:31 AM

## 2015-02-23 NOTE — H&P (Signed)
  The recent History & Physical has been reviewed. I have personally examined the patient today. There is no interval change to the documented History & Physical. The patient would like to proceed with the procedure.  Lucas Curry W 02/23/2015,  7:07 AM

## 2015-02-23 NOTE — Anesthesia Preprocedure Evaluation (Signed)
Anesthesia Evaluation  Patient identified by MRN, date of birth, ID band Patient awake    Reviewed: Allergy & Precautions, NPO status , Patient's Chart, lab work & pertinent test results  Airway Mallampati: II  TM Distance: >3 FB Neck ROM: Full    Dental no notable dental hx.    Pulmonary neg pulmonary ROS, former smoker,    Pulmonary exam normal breath sounds clear to auscultation       Cardiovascular hypertension, Normal cardiovascular exam+ Valvular Problems/Murmurs AI  Rhythm:Regular Rate:Normal  Aortic valve disorders  moderate to severe aortic insufficiency both by catheterization and echocardiogram. Ejection fraction 55-60%, low cardiac output. Cardiac catheterization 1212 calculated valve area 1.14 cm made the abnormal secondary to aortic insufficiency and no significant coronary artery disease. Normal filling pressures without significant pulmonary hypertension     Neuro/Psych negative neurological ROS  negative psych ROS   GI/Hepatic negative GI ROS, Neg liver ROS,   Endo/Other  negative endocrine ROS  Renal/GU negative Renal ROS  negative genitourinary   Musculoskeletal negative musculoskeletal ROS (+)   Abdominal   Peds negative pediatric ROS (+)  Hematology negative hematology ROS (+)   Anesthesia Other Findings   Reproductive/Obstetrics negative OB ROS                             Anesthesia Physical Anesthesia Plan  ASA: III  Anesthesia Plan: General   Post-op Pain Management:    Induction: Intravenous  Airway Management Planned: Oral ETT  Additional Equipment:   Intra-op Plan:   Post-operative Plan: Extubation in OR  Informed Consent: I have reviewed the patients History and Physical, chart, labs and discussed the procedure including the risks, benefits and alternatives for the proposed anesthesia with the patient or authorized representative who has indicated  his/her understanding and acceptance.   Dental advisory given  Plan Discussed with: CRNA and Surgeon  Anesthesia Plan Comments:         Anesthesia Quick Evaluation

## 2015-02-23 NOTE — Anesthesia Procedure Notes (Signed)
Procedure Name: Intubation Date/Time: 103/11/202016 7:23 AM Performed by: Coralee RudFLORES, Arlenne Kimbley Pre-anesthesia Checklist: Patient identified, Emergency Drugs available, Suction available, Patient being monitored and Timeout performed Patient Re-evaluated:Patient Re-evaluated prior to inductionOxygen Delivery Method: Circle system utilized Preoxygenation: Pre-oxygenation with 100% oxygen Intubation Type: IV induction Ventilation: Mask ventilation without difficulty Laryngoscope Size: Miller and 3 Grade View: Grade I Tube type: Oral Tube size: 7.5 mm Number of attempts: 1 Airway Equipment and Method: Stylet Placement Confirmation: ETT inserted through vocal cords under direct vision,  positive ETCO2 and breath sounds checked- equal and bilateral Secured at: 22 cm Tube secured with: Tape Dental Injury: Teeth and Oropharynx as per pre-operative assessment

## 2015-02-23 NOTE — Discharge Instructions (Signed)
Information on my medicine - XARELTO® (Rivaroxaban) ° °This medication education was reviewed with me or my healthcare representative as part of my discharge preparation.  The pharmacist that spoke with me during my hospital stay was:  Merrisa Skorupski Stillinger, RPH ° °Why was Xarelto® prescribed for you? °Xarelto® was prescribed for you to reduce the risk of blood clots forming after orthopedic surgery. The medical term for these abnormal blood clots is venous thromboembolism (VTE). ° °What do you need to know about xarelto® ? °Take your Xarelto® ONCE DAILY at the same time every day. °You may take it either with or without food. ° °If you have difficulty swallowing the tablet whole, you may crush it and mix in applesauce just prior to taking your dose. ° °Take Xarelto® exactly as prescribed by your doctor and DO NOT stop taking Xarelto® without talking to the doctor who prescribed the medication.  Stopping without other VTE prevention medication to take the place of Xarelto® may increase your risk of developing a clot. ° °After discharge, you should have regular check-up appointments with your healthcare provider that is prescribing your Xarelto®.   ° °What do you do if you miss a dose? °If you miss a dose, take it as soon as you remember on the same day then continue your regularly scheduled once daily regimen the next day. Do not take two doses of Xarelto® on the same day.  ° °Important Safety Information °A possible side effect of Xarelto® is bleeding. You should call your healthcare provider right away if you experience any of the following: °? Bleeding from an injury or your nose that does not stop. °? Unusual colored urine (red or dark brown) or unusual colored stools (red or black). °? Unusual bruising for unknown reasons. °? A serious fall or if you hit your head (even if there is no bleeding). ° °Some medicines may interact with Xarelto® and might increase your risk of bleeding while on Xarelto®. To  help avoid this, consult your healthcare provider or pharmacist prior to using any new prescription or non-prescription medications, including herbals, vitamins, non-steroidal anti-inflammatory drugs (NSAIDs) and supplements. ° °This website has more information on Xarelto®: www.xarelto.com. ° ° ° °

## 2015-02-23 NOTE — Care Management (Signed)
Utilization review completed. Idalis Hoelting, RN Case Manager [phone]. 
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
## 2015-02-23 NOTE — Anesthesia Postprocedure Evaluation (Signed)
Anesthesia Post Note  Patient: Lucas Curry  Procedure(s) Performed: Procedure(s) (LRB): TOTAL HIP ARTHROPLASTY (Left)  Patient location during evaluation: PACU Anesthesia Type: General Level of consciousness: awake and alert Pain management: pain level controlled Vital Signs Assessment: post-procedure vital signs reviewed and stable Respiratory status: spontaneous breathing, nonlabored ventilation, respiratory function stable and patient connected to nasal cannula oxygen Cardiovascular status: blood pressure returned to baseline and stable Postop Assessment: no signs of nausea or vomiting Anesthetic complications: no    Last Vitals:  Filed Vitals:   02/23/15 1030 02/23/15 1045  BP: 163/88 136/77  Pulse: 92 98  Temp:    Resp: 13 16    Last Pain:  Filed Vitals:   02/23/15 1049  PainSc: 10-Worst pain ever    LLE Motor Response: Purposeful movement LLE Sensation: Full sensation          Kensli Bowley S

## 2015-02-24 ENCOUNTER — Encounter (HOSPITAL_COMMUNITY): Payer: Self-pay | Admitting: General Practice

## 2015-02-24 LAB — CBC
HEMATOCRIT: 28.8 % — AB (ref 39.0–52.0)
Hemoglobin: 9.6 g/dL — ABNORMAL LOW (ref 13.0–17.0)
MCH: 31.5 pg (ref 26.0–34.0)
MCHC: 33.3 g/dL (ref 30.0–36.0)
MCV: 94.4 fL (ref 78.0–100.0)
Platelets: 141 10*3/uL — ABNORMAL LOW (ref 150–400)
RBC: 3.05 MIL/uL — ABNORMAL LOW (ref 4.22–5.81)
RDW: 13.8 % (ref 11.5–15.5)
WBC: 6.8 10*3/uL (ref 4.0–10.5)

## 2015-02-24 LAB — BASIC METABOLIC PANEL
ANION GAP: 5 (ref 5–15)
BUN: 15 mg/dL (ref 6–20)
CALCIUM: 8.3 mg/dL — AB (ref 8.9–10.3)
CO2: 26 mmol/L (ref 22–32)
CREATININE: 0.78 mg/dL (ref 0.61–1.24)
Chloride: 106 mmol/L (ref 101–111)
Glucose, Bld: 117 mg/dL — ABNORMAL HIGH (ref 65–99)
Potassium: 3.9 mmol/L (ref 3.5–5.1)
SODIUM: 137 mmol/L (ref 135–145)

## 2015-02-24 NOTE — Progress Notes (Signed)
Patient ID: Lucas Curry, male   DOB: 1956-03-01, 59 y.o.   MRN: [id number] PATIENT ID: Lucas Curry        MRN:  [id number]          DOB/AGE: 11-12-1955 / 59 y.o.    Norlene Campbell, MD   Jacqualine Code, PA-C [address] [place], [place]  [address]                             [phone]   PROGRESS NOTE  Subjective:  negative for Chest Pain  negative for Shortness of Breath  negative for Nausea/Vomiting   negative for Calf Pain    Tolerating Diet: yes         Patient reports pain as moderate.     Has been on chronic pain meds which will make it difficult to control pain-comfortable at present-some difficulty voiding  Objective: Vital signs in last 24 hours:   Patient Vitals for the past 24 hrs:  BP Temp Temp src Pulse Resp SpO2  02/24/15 0528 (!) 110/54 mmHg 98.4 F (36.9 C) Oral 79 15 99 %  02/24/15 0128 (!) 92/47 mmHg 98.2 F (36.8 C) Oral 87 13 98 %  02/23/15 2146 (!) 93/51 mmHg 98.2 F (36.8 C) Oral 83 12 97 %  02/23/15 1305 (!) 146/94 mmHg 98.3 F (36.8 C) Oral 81 12 99 %  02/23/15 1145 122/75 mmHg - - 83 11 98 %  02/23/15 1130 119/72 mmHg - - 89 13 97 %  02/23/15 1115 131/81 mmHg - - 91 13 99 %  02/23/15 1100 (!) 143/76 mmHg - - 98 (!) 23 99 %  02/23/15 1045 136/77 mmHg - - 98 16 100 %  02/23/15 1030 (!) 163/88 mmHg - - 92 13 100 %  02/23/15 1015 (!) 183/97 mmHg - - 97 17 100 %  02/23/15 1000 (!) 180/99 mmHg - - 94 15 100 %  02/23/15 0953 (!) 180/99 mmHg 97.8 F (36.6 C) - 99 - -      Intake/Output from previous day:   11/29 0701 - 11/30 0700 In: 2030 [P.O.:480; I.V.:1000] Out: 1200 [Urine:550]   Intake/Output this shift:       Intake/Output      11/29 0701 - 11/30 0700 11/30 0701 - 12/01 0700   P.O. 480    I.V. (mL/kg) 1000 (11.4)    IV Piggyback 550    Total Intake(mL/kg) 2030 (23.2)    Urine (mL/kg/hr) 550 (0.3)    Blood 650 (0.3)    Total Output 1200     Net +830          Urine Occurrence 1 x       LABORATORY  DATA:  Recent Labs  02/24/15 0534  WBC 6.8  HGB 9.6*  HCT 28.8*  PLT 141*    Recent Labs  02/24/15 0534  NA 137  K 3.9  CL 106  CO2 26  BUN 15  CREATININE 0.78  GLUCOSE 117*  CALCIUM 8.3*   Lab Results  Component Value Date   INR 1.05 02/15/2015   INR 0.94 03/08/2011    Recent Radiographic Studies :  Dg Chest 2 View  02/15/2015  CLINICAL DATA:  Preoperative evaluation for upcoming hip surgery EXAM: CHEST - 2 VIEW COMPARISON:  None. FINDINGS: The heart size and mediastinal contours are within normal limits. Both lungs are clear. The visualized skeletal structures are unremarkable. IMPRESSION: No active disease. Electronically  Signed   By: Alcide CleverMark  Lukens M.D.   On: 02/15/2015 16:12   Dg Hip Port Unilat With Pelvis 1v Left  12020-02-2415  CLINICAL DATA:  Status post left hip replacement. EXAM: DG HIP (WITH OR WITHOUT PELVIS) 1V PORT LEFT COMPARISON:  May 19, 2014. FINDINGS: Status post left total hip arthroplasty. The femoral and acetabular components appear to be well situated. Severe degenerative change of the right hip is noted. No acute fracture or dislocation is noted. IMPRESSION: Status post left total hip arthroplasty. Electronically Signed   By: Lupita RaiderJames  Green Jr, M.D.   On: 12020-02-2415 12:14     Examination:  General appearance: alert, cooperative and no distress  Wound Exam: clean, dry, intact   Drainage:  None: wound tissue dry  Motor Exam: EHL, FHL, Anterior Tibial and Posterior Tibial Intact  Sensory Exam: Superficial Peroneal, Deep Peroneal and Tibial normal  Vascular Exam: Normal  Assessment:    1 Day Post-Op  Procedure(s) (LRB): TOTAL HIP ARTHROPLASTY (Left)  ADDITIONAL DIAGNOSIS:  Principal Problem:   Avascular necrosis of left femoral head (HCC) Active Problems:   S/P total hip arthroplasty  Acute Blood Loss Anemia-asymptomatic    Plan: Physical Therapy as ordered Weight Bearing as Tolerated (WBAT)  DVT Prophylaxis:  Xarelto, Foot  Pumps and TED hose  DISCHARGE PLAN: Home  DISCHARGE NEEDS: HHPT, Walker and 3-in-1 comode seat    OOB with PT, possible I/O cath, saline lock IV, monitor labs    Georgi Tuel W  02/24/2015 8:00 AM
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
## 2015-02-24 NOTE — Op Note (Signed)
NAMEMarland Curry  SHAQUEL, Curry NO.:  [id number]  MEDICAL RECORD NO.:  [id number]  LOCATION:  5N05C                        FACILITY:  [facility]  PHYSICIAN:  Vonna Kotyk. Rubbie Goostree, M.D.DATE OF BIRTH:  September 09, 1955  DATE OF PROCEDURE:  12020-11-1114 DATE OF DISCHARGE:                              OPERATIVE REPORT   PREOPERATIVE DIAGNOSIS:  End-stage osteoarthritis, left hip.  POSTOPERATIVE DIAGNOSIS:  End-stage osteoarthritis, left hip.  PROCEDURE:  Left total hip replacement.  SURGEON:  Vonna Kotyk. Durward Fortes, M.D.  ASSISTANT:  Aaron Edelman D. Petrarca, PA-C, who was present throughout the operative procedure to ensure its timely completion.  ANESTHESIA:  General.  COMPLICATIONS:  None.  COMPONENTS:  DePuy AML large stature 12-mm femoral stem, a 54-mm outer diameter Sector III Gription metallic acetabular component and apex hole eliminator, and a Marathon polyethylene liner +4 with a 10-degree posterior lip.  Components were press-fit.  DESCRIPTION OF PROCEDURE:  [name] was met with his daughter in the holding area and identified the left hip as the appropriate operative site and marked it accordingly.  The patient was then transported to room #7 and placed under general anesthesia without difficulty.  He was then placed in the lateral decubitus position with the left side up and secured to the operating table with the Innomed hip system.  The left lower extremity was then prepped with chlorhexidine scrub and DuraPrep x2 from iliac crest to the ankle.  Sterile draping was performed.  A time-out was called.  A routine Southern incision was utilized and via sharp dissection, carried down to subcutaneous tissue.  Gross bleeders were Bovie coagulated.  Adipose tissue was incised to the level of the iliotibial band.  This was incised along the length of the skin incision.  Self- retaining retractors were inserted.  Muscle fibers were then manually separated through a raphe within  the muscle.  Self-retaining retractors were placed more deeply.  The short external rotators were identified.  Adipose tissue superficial to the structures were then incised and then retracted posteriorly. Short external rotators were incised with the Bovie.  Tendinous structures were tagged with 0 Ethibond suture.  The capsule was identified and incised along the femoral neck and head.  There was a minimal clear yellow joint effusion.  The head was then dislocated posteriorly.  Using the calcar guide, the head was amputated right at the head-neck junction.  There was a very short neck, so, I want to maintain as much neck as possible.  Retractors were then placed about the proximal femur.  There were still some portions of the head around the neck and these were removed with a rongeur.  The neck cut was about a fingerbreadth proximal to the lesser trochanter.  A starter awl was then made in the piriformis fossa.  The canal finder was inserted.  Reaming was performed to 12 mm, which was line to line for the 12-mm component, nice tight fit with plenty of endosteal bone.  Rasping was performed sequentially to 12-mm large stature femoral component with nice fit, was nice and stable and an excellent cut in the calcar.  I had difficulty inserting retractors around the acetabulum that was quite deformed.  The head was significantly deformed.  I suspected that he may have had some congenital abnormalities, there was a very shallow acetabulum and the head was more oblong than round.  I stripped the capsule around the femur with a Cobb elevator.  I inserted the retractor about the acetabulum.  There was abundant soft tissue within the acetabulum, this was sharply excised.  Reaming was somewhat difficult because of the deformity, but we felt like we had excellent position.  I started with a 44 reamer and advanced to a 53-mm outer diameter reamer to accept a 54 component.  I then trialed the  52 component with a nice rim fit, but we completely seat, the 54 would not completely seat with good rim fit.  Accordingly, the [employer] III acetabular component with 54-mm outer diameter was then impacted.  I thought a very nice position using the external guide.  The trial Marathon liner was then inserted followed by the 12-mm large stature femoral rasp and the 5-mm neck.  This was reduced and through a full range of motion, we could not sublux the hip.  We felt that there was some impingement along the acetabulum anteriorly and then at that point, removed the femoral component.  I removed the osteophyte anteriorly with a small rongeur and then reinserted the femoral components.  We re-reduced the hip and then through a full range of motion, there was no subluxation.  We felt that there was no toggling or further impingement, and excellent stability.  The trial components were then removed.  The joint was irrigated with saline solution.  An apex hole eliminator was inserted followed by the final Marathon polyethylene liner +4 with a 10-degree posterior lip.  We then checked to be sure it was stable.  The acetabulum was again irrigated.  The final femoral component was then impacted, flushed on the calcar with the 12-mm large stature femoral component.  We were just a little bit proud by 1 mm or 2 on the femoral component, so I elected to trial the 1.5-mm neck length.  This was applied to the femoral component and initially was reduced and I thought that we had excellent stability and leg lengths remained stable.  We did no longer needed the +5 neck length.  We then dislocated the hip posteriorly.  I cleaned the Morse taper neck and applied the final 1.5-mm neck length with 36-mm outer diameter hip ball.  Acetabulum was cleaned.  The entire construct was then reduced and again through a full range of motion, we could not sublux the hip. We had excellent stability.  I felt leg  lengths were symmetrical.  The wound was again irrigated with saline solution.  We injected 0.25% Marcaine into the capsule.  The capsule was then closed anatomically with #1 Ethibond suture.  The short external rotator was closed with the same material.  Wound was again irrigated.  The IT band and muscle fascia were closed with a running #1 Vicryl, subcu in several layers with 2-0 Vicryl, 3-0 Monocryl and skin clips.  Sterile bulky dressing was applied.  The patient was then placed supine on the operating room table.  Knee immobilizer was placed about the left knee. The patient was then awakened, transferred to the operating room stretcher to the postanesthesia recovery room without complications.     Vonna Kotyk. Durward Fortes, M.D.     PWW/MEDQ  D:  107/06/202016  T:  02/24/2015  Job:  [id number]
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
## 2015-02-25 LAB — BASIC METABOLIC PANEL
ANION GAP: 5 (ref 5–15)
BUN: 9 mg/dL (ref 6–20)
CALCIUM: 8.5 mg/dL — AB (ref 8.9–10.3)
CO2: 26 mmol/L (ref 22–32)
CREATININE: 0.63 mg/dL (ref 0.61–1.24)
Chloride: 104 mmol/L (ref 101–111)
Glucose, Bld: 119 mg/dL — ABNORMAL HIGH (ref 65–99)
Potassium: 3.9 mmol/L (ref 3.5–5.1)
SODIUM: 135 mmol/L (ref 135–145)

## 2015-02-25 LAB — CBC
HCT: 28.2 % — ABNORMAL LOW (ref 39.0–52.0)
Hemoglobin: 9.6 g/dL — ABNORMAL LOW (ref 13.0–17.0)
MCH: 32.1 pg (ref 26.0–34.0)
MCHC: 34 g/dL (ref 30.0–36.0)
MCV: 94.3 fL (ref 78.0–100.0)
PLATELETS: 141 10*3/uL — AB (ref 150–400)
RBC: 2.99 MIL/uL — ABNORMAL LOW (ref 4.22–5.81)
RDW: 13.6 % (ref 11.5–15.5)
WBC: 9.5 10*3/uL (ref 4.0–10.5)

## 2015-02-25 MED ORDER — RIVAROXABAN 10 MG PO TABS: 10.0000 mg | ORAL_TABLET | Freq: Every day | ORAL | Status: DC

## 2015-02-25 MED ORDER — OXYCODONE HCL 5 MG PO TABS: 5.0000 mg | ORAL_TABLET | ORAL | Status: DC | PRN

## 2015-02-25 MED ORDER — METHOCARBAMOL 500 MG PO TABS: 500.0000 mg | ORAL_TABLET | Freq: Three times a day (TID) | ORAL | Status: AC | PRN

## 2015-02-25 NOTE — Care Management Note (Signed)
Case Management Note  Patient Details  Name: Lucas Curry MRN: [id number] Date of Birth: 1955/04/11  Subjective/Objective:   59 yr old male s/p left total hip arthroplasty.                    Action/Plan:  Case manager spoke with patient concerning home health and DME needs. Patient was being reviewed for SNF for shortterm rehab but prefers to go home. He has a daughter and an aide that will be assisting him. [facility] no changes. Patient states he will need a rolling walker and a 3in1. Case Manager requested these items from [facility]. CM notified Ayesha RumpfMary Yonjof, [facility] Liaison that patient will be discharging to home.      Expected Discharge Date:    02/25/15              Expected Discharge Plan:   Home with Home Health  In-House Referral:  NA  Discharge planning Services  CM Consult  Post Acute Care Choice:  Durable Medical Equipment, Home Health Choice offered to:     DME Arranged:  3-N-1, Walker rolling DME Agency:  [facility]  HH Arranged:  [facility]  Status of Service:  Completed, signed off  Medicare Important Message Given:    Date Medicare IM Given:    Medicare IM give by:    Date Additional Medicare IM Given:    Additional Medicare Important Message give by:     If discussed at Long Length of Stay Meetings, dates discussed:    Additional Comments:  Durenda GuthrieBrady, Sharonann Malbrough Naomi, RN 02/25/2015, 1:47 PM
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
## 2015-02-25 NOTE — Progress Notes (Signed)
Subjective: 2 Days Post-Op Procedure(s) (LRB): TOTAL HIP ARTHROPLASTY (Left) Patient reports pain as moderate.    Objective: Vital signs in last 24 hours: Temp:  [98.2 F (36.8 C)-99.3 F (37.4 C)] 98.2 F (36.8 C) (12/01 0502) Pulse Rate:  [96-106] 96 (12/01 0502) Resp:  [18] 18 (12/01 0502) BP: (124-136)/(59-67) 136/67 mmHg (12/01 1113) SpO2:  [99 %-100 %] 100 % (12/01 0502)  Intake/Output from previous day: 11/30 0701 - 12/01 0700 In: -  Out: 450 [Urine:450] Intake/Output this shift:     Recent Labs  02/24/15 0534 02/25/15 0523  HGB 9.6* 9.6*    Recent Labs  02/24/15 0534 02/25/15 0523  WBC 6.8 9.5  RBC 3.05* 2.99*  HCT 28.8* 28.2*  PLT 141* 141*    Recent Labs  02/24/15 0534 02/25/15 0523  NA 137 135  K 3.9 3.9  CL 106 104  CO2 26 26  BUN 15 9  CREATININE 0.78 0.63  GLUCOSE 117* 119*  CALCIUM 8.3* 8.5*   No results for input(s): LABPT, INR in the last 72 hours.  Neurovascular intact Sensation intact distally Intact pulses distally Dorsiflexion/Plantar flexion intact Incision: dressing C/D/I and no drainage  Assessment/Plan: 2 Days Post-Op Procedure(s) (LRB): TOTAL HIP ARTHROPLASTY (Left) Up with therapy Discharge home with home health today  [facility]AR[facility],BRIAN 02/25/2015, 1:10 PM

## 2015-02-25 NOTE — Clinical Social Work Note (Signed)
CSW received referral for SNF.  Case discussed with case manager and plan is to discharge home.  CSW to sign off please re-consult if social work needs arise.  Abriel Geesey R. Josanna Hefel, MSW, LCSWA [phone]  
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
## 2015-04-14 ENCOUNTER — Other Ambulatory Visit: Payer: Self-pay | Admitting: *Deleted

## 2015-04-14 MED ORDER — LISINOPRIL 20 MG PO TABS: 20.0000 mg | ORAL_TABLET | Freq: Every day | ORAL | Status: AC

## 2015-08-30 ENCOUNTER — Ambulatory Visit: Payer: Medicaid Other | Admitting: Physical Therapy

## 2015-09-30 ENCOUNTER — Ambulatory Visit: Payer: Medicaid Other | Attending: Student | Admitting: Physical Therapy

## 2015-09-30 DIAGNOSIS — R2689 Other abnormalities of gait and mobility: Secondary | ICD-10-CM | POA: Insufficient documentation

## 2015-09-30 DIAGNOSIS — M25551 Pain in right hip: Secondary | ICD-10-CM | POA: Diagnosis present
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
## 2015-10-07 ENCOUNTER — Telehealth: Payer: Self-pay | Admitting: *Deleted

## 2015-10-07 NOTE — Telephone Encounter (Signed)
Message left on nurse voice mail - PMD upped BP med & wants to make sure okay with Dr. Kirtland BouchardK.  Attempted to return call - left message.

## 2015-10-08 NOTE — Telephone Encounter (Signed)
PMD just increased his Lisinopril to 20mg  twice a day.  Wanted to make sure MD here was in agreement.

## 2015-10-11 NOTE — Telephone Encounter (Signed)
Patient notified

## 2015-10-11 NOTE — Telephone Encounter (Signed)
I think that is fine.

## 2015-10-14 ENCOUNTER — Telehealth: Payer: Self-pay | Admitting: *Deleted

## 2015-10-14 NOTE — Telephone Encounter (Signed)
Wants to know if Dr. Purvis SheffieldKoneswaran will give him prescription for generic Viagra, can get cheaper at [facility].  Could not afford the Cialis that was prescribed for him in the past.

## 2015-10-15 MED ORDER — SILDENAFIL CITRATE 20 MG PO TABS: ORAL_TABLET | ORAL | Status: DC

## 2015-10-15 NOTE — Telephone Encounter (Signed)
Sildnenafil 20mg  (Viagra) - may take 2-3 tabs as needed for erectile dysfunction.  #50 + 1 refill - verbally given to [facility].    Patient notified via voice mail.

## 2015-10-15 NOTE — Telephone Encounter (Signed)
That would be fine 

## 2015-10-27 ENCOUNTER — Encounter: Payer: Self-pay | Admitting: Physical Therapy

## 2015-10-27 ENCOUNTER — Ambulatory Visit: Payer: Medicaid Other | Attending: Student | Admitting: Physical Therapy

## 2015-10-27 DIAGNOSIS — M25551 Pain in right hip: Secondary | ICD-10-CM | POA: Insufficient documentation

## 2015-10-27 DIAGNOSIS — R2689 Other abnormalities of gait and mobility: Secondary | ICD-10-CM

## 2015-10-27 NOTE — Therapy (Signed)
[facility] [address] [place], [place], [address] Phone: [phone]   Fax:  [phone]  Physical Therapy Treatment  Patient Details  Name: Lucas Curry MRN: [id number] Date of Birth: 1956-01-13 Referring Provider: Levonne Lapping MD  Encounter Date: 10/27/2015      PT End of Session - 10/27/15 1113    Visit Number 2   Number of Visits 12   Date for PT Re-Evaluation 11/11/15   PT Start Time 1033   PT Stop Time 1113   PT Time Calculation (min) 40 min   Activity Tolerance Patient tolerated treatment well   Behavior During Therapy [facility] for tasks assessed/performed      Past Medical History:  Diagnosis Date  . Abdominal pain    chronic secondary to scar  . Aortic ectasia, unspecified site [facility])    Aortic dilatation  . Aortic valve disorders    moderate to severe aortic insufficiency both by catheterization and echocardiogram.  Ejection fraction 55-60%, low cardiac output.  Cardiac catheterization 1212 calculated valve area 1.14 cm made the abnormal secondary to aortic insufficiency and no significant coronary artery disease.  Normal filling pressures without significant pulmonary hypertension  . Chronic pain due to trauma    patient walks with a limp status post pinning of his right leg  . Coronary artery disease (CAD) excluded    Status post cardiac catheterization  . ETOH abuse   . Heart murmur   . Hypertension   . Limb pain   . Palpitations   . Peripheral vascular disease, unspecified (HCC)    normal ABIs 2012  . Problems with hearing   . Sciatica   . 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 Procedure January 2012    Past Surgical History:  Procedure Laterality Date  . ABDOMINAL SURGERY  1981   due trauma reports that he has pins and some in chest  . KNEE AND LEG orif Right 1984    TRAUMA  . LEFT AND RIGHT HEART CATHETERIZATION WITH CORONARY ANGIOGRAM N/A 03/08/2011   Procedure: LEFT AND RIGHT HEART CATHETERIZATION WITH CORONARY ANGIOGRAM;  Surgeon: Iran Ouch, MD;  Location: [facility] CATH LAB;  Service: Cardiovascular;  Laterality: N/A;  . LUMBAR FUSION  2015  . plastic surgery right eye  1981  . RIGHT FOREARM ORIF  1981   TRAUMA  . TOTAL HIP ARTHROPLASTY Left 12020/01/1615  . TOTAL HIP ARTHROPLASTY Left 12020/01/1615   Procedure: TOTAL HIP ARTHROPLASTY;  Surgeon: Valeria Batman, MD;  Location: [facility] OR;  Service: Orthopedics;  Laterality: Left;  . WISDOM TOOTH EXTRACTION      There were no vitals filed for this visit.      Subjective Assessment - 10/27/15 1038    Subjective I feel ok, I'm able to put more weight on my leg, still having trouble sleeping   Currently in Pain? Yes   Pain Score 8    Pain Location Hip   Pain Orientation Right   Pain Descriptors / Indicators Sore;Aching   Pain Type Surgical pain   Pain Onset 1 to 4 weeks ago                         [facility] Adult PT Treatment/Exercise - 10/27/15 0001      Exercises   Exercises Knee/Hip     Knee/Hip Exercises: Aerobic   Nustep within hip  precautions level 3 x 5 minutes     Knee/Hip Exercises: Standing   Other Standing Knee Exercises wt shifts all directions to promote neutral alignment and increase wt bearing on Rt LE     Knee/Hip Exercises: Supine   Quad Sets Right;2 sets;10 reps  towel under knee   Heel Slides Strengthening;Right;2 sets;10 reps   Hip Adduction Isometric Strengthening;2 sets;10 reps   Other Supine Knee/Hip Exercises hip abduction AAROM x 15 (limited by knee pain)   Other Supine Knee/Hip Exercises glute squeeze x 20                PT Education - 10/27/15 1113    Education provided Yes   Education Details HEP   Person(s) Educated Patient   Methods Explanation;Demonstration;Verbal cues   Comprehension Verbalized understanding;Returned demonstration              PT Long Term Goals - 10/27/15 1114      PT LONG TERM GOAL #1   Title Ind with a HEP.   Status On-going     PT LONG TERM GOAL #2   Title Right hip strength improved to 4+/5   Baseline Right hip abduction= 3/5.   Status On-going     PT LONG TERM GOAL #3   Title Normalize gait pattern.   Baseline Antalgic gait pattern with multiple deviations.   Status On-going     PT LONG TERM GOAL #4   Title Perform ADL's with pain not > 3/10.   Baseline Patient's pain rises to 9/10 with ADL's, sitting and standing.   Status On-going               Plan - 10/27/15 1113    Clinical Impression Statement Pt tolerated therex well today, some increased pain with abduction. Pt given HEP and pt is excited to "get better".  no increased pain with wt bearing activities today   Rehab Potential Excellent   PT Frequency 2x / week   PT Duration 6 weeks   PT Treatment/Interventions ADLs/Self Care Home Management;Cryotherapy;Electrical Stimulation;Moist Heat;Therapeutic exercise;Therapeutic activities;Gait training;Patient/family education;Manual techniques   PT Next Visit Plan Nustep; gait training; right LE strengthening, assess HEP      Patient will benefit from skilled therapeutic intervention in order to improve the following deficits and impairments:  Abnormal gait  Visit Diagnosis: Other abnormalities of gait and mobility     Problem List Patient Active Problem List   Diagnosis Date Noted  . Avascular necrosis of left femoral head (HCC) 1Apr 30, 202016  . S/P total hip arthroplasty 1Apr 30, 202016  . Aortic valve disorder   . Aortic ectasia, unspecified site (HCC)   . Sciatica   . Coronary artery disease (CAD) excluded 03/29/2011  . Aortic dilatation (HCC) 07/18/2010  . HYPERLIPIDEMIA 06/03/2010  . TOBACCO ABUSE 06/03/2010  . Essential hypertension 06/03/2010  . CLAUDICATION, INTERMITTENT 06/03/2010  . PALPITATIONS 06/03/2010  . Shortness of breath 06/03/2010     Reggy Eye, PT, DPT 10/27/2015, 11:15 AM  [facility] [address] [place], [place], [address] Phone: [phone]   Fax:  [phone]  Name: Lucas Curry MRN: [id number] Date of Birth: Jun 26, 1955

## 2015-10-27 NOTE — Patient Instructions (Addendum)
Abduction    Slide one leg out to side. Keep kneecap pointing up. Gently bring leg back to pillow. Repeat with other leg. Repeat ____ times. Do ____ sessions per day.  http://gt2.exer.us/373   Copyright  VHI. All rights reserved.  Heel Slide    Bend knee and pull heel toward buttocks. Hold ____ seconds. Return. Repeat with other knee. Repeat ____ times. Do ____ sessions per day.  http://gt2.exer.us/371   Copyright  VHI. All rights reserved.  Quad Set    Slowly tighten muscles on thigh of straight leg while counting out loud to ____. Repeat with other leg. Repeat ____ times. Do ____ sessions per day.  http://gt2.exer.us/361   Copyright  VHI. All rights reserved.  Gluteal Squeeze    Squeeze buttocks muscles as tightly as possible while counting out loud to ____. Repeat ____ times. Do ____ sessions per day.  http://gt2.exer.us/363   Copyright  VHI. All rights reserved.

## 2015-11-02 ENCOUNTER — Ambulatory Visit: Payer: Medicaid Other | Admitting: *Deleted

## 2015-11-02 DIAGNOSIS — R2689 Other abnormalities of gait and mobility: Secondary | ICD-10-CM

## 2015-11-02 DIAGNOSIS — M25551 Pain in right hip: Secondary | ICD-10-CM

## 2015-11-02 NOTE — Therapy (Signed)
[facility] [address] [place], [place], [address] Phone: [phone]   Fax:  [phone]  Physical Therapy Treatment  Patient Details  Name: Lucas LikensMarshall R Epperly MRN: [id number] Date of Birth: [birth year] Referring Provider: Levonne LappingMaxwell Langfitt MD  Encounter Date: 11/02/2015      PT End of Session - 11/02/15 1032    Visit Number 3   Number of Visits 12   Date for PT Re-Evaluation 11/11/15   PT Start Time 0945   PT Stop Time 1036   PT Time Calculation (min) 51 min      Past Medical History:  Diagnosis Date  . Abdominal pain    chronic secondary to scar  . Aortic ectasia, unspecified site [facility](HCC)    Aortic dilatation  . Aortic valve disorders    moderate to severe aortic insufficiency both by catheterization and echocardiogram.  Ejection fraction 55-60%, low cardiac output.  Cardiac catheterization 1212 calculated valve area 1.14 cm made the abnormal secondary to aortic insufficiency and no significant coronary artery disease.  Normal filling pressures without significant pulmonary hypertension  . Chronic pain due to trauma    patient walks with a limp status post pinning of his right leg  . Coronary artery disease (CAD) excluded    Status post cardiac catheterization  . ETOH abuse   . Heart murmur   . Hypertension   . Limb pain   . Palpitations   . Peripheral vascular disease, unspecified (HCC)    normal ABIs 2012  . Problems with hearing   . Sciatica   . 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 Procedure January 2012    Past Surgical History:  Procedure Laterality Date  . ABDOMINAL SURGERY  1981   due trauma reports that he has pins and some in chest  . KNEE AND LEG orif Right 1984   TRAUMA  . LEFT AND RIGHT HEART CATHETERIZATION WITH CORONARY ANGIOGRAM N/A 03/08/2011   Procedure: LEFT AND RIGHT  HEART CATHETERIZATION WITH CORONARY ANGIOGRAM;  Surgeon: Iran OuchMuhammad A Arida, MD;  Location: [facility] CATH LAB;  Service: Cardiovascular;  Laterality: N/A;  . LUMBAR FUSION  2015  . plastic surgery right eye  1981  . RIGHT FOREARM ORIF  1981   TRAUMA  . TOTAL HIP ARTHROPLASTY Left 02/23/2015  . TOTAL HIP ARTHROPLASTY Left 02/23/2015   Procedure: TOTAL HIP ARTHROPLASTY;  Surgeon: Valeria BatmanPeter W Whitfield, MD;  [facility];  Service: Orthopedics;  Laterality: Left;  . WISDOM TOOTH EXTRACTION      There were no vitals filed for this visit.                       [facility] Adult PT Treatment/Exercise - 11/02/15 0001      Exercises   Exercises Knee/Hip     Knee/Hip Exercises: Aerobic   Nustep within hip precautions seat 11level 3 x 15minutes     Knee/Hip Exercises: Standing   Hip Flexion AROM;Both;3 sets;10 reps   Hip ADduction Right;3 sets;10 reps;5 sets;2 sets   Rocker Board 5 minutes    calf stretching andbalance     Knee/Hip Exercises: Seated   Long Arc Quad Strengthening;Right;4 sets;10 reps   Long Arc Quad Weight 3 lbs.   Clamshell with TheraBand Red  3x10   Sit to Sand 3 sets;10 reps;with UE support  Raised mat table  PT Long Term Goals - 10/27/15 1114      PT LONG TERM GOAL #1   Title Ind with a HEP.   Status On-going     PT LONG TERM GOAL #2   Title Right hip strength improved to 4+/5   Baseline Right hip abduction= 3/5.   Status On-going     PT LONG TERM GOAL #3   Title Normalize gait pattern.   Baseline Antalgic gait pattern with multiple deviations.   Status On-going     PT LONG TERM GOAL #4   Title Perform ADL's with pain not > 3/10.   Baseline Patient's pain rises to 9/10 with ADL's, sitting and standing.   Status On-going               Plan - 11/02/15 1813    Clinical Impression Statement Pt did fairly well today and was able to perform some standing exs in the // bars for his RT hip. He did well with  standing hip abduction with no knee pain as with supine. We also discussed his posure during ambulation and he is trying to stand more upright and avoid flexed posture. He also did well with sit to stand on raised mat table. Goals are on-going   Rehab Potential Excellent   PT Frequency 2x / week   PT Duration 6 weeks   PT Treatment/Interventions ADLs/Self Care Home Management;Cryotherapy;Electrical Stimulation;Moist Heat;Therapeutic exercise;Therapeutic activities;Gait training;Patient/family education;Manual techniques   PT Next Visit Plan Nustep; gait training; right LE strengthening, assess HEP   Consulted and Agree with Plan of Care Patient      Patient will benefit from skilled therapeutic intervention in order to improve the following deficits and impairments:  Abnormal gait  Visit Diagnosis: Other abnormalities of gait and mobility  Pain in right hip     Problem List Patient Active Problem List   Diagnosis Date Noted  . Avascular necrosis of left femoral head (HCC) 02/23/2015  . S/P total hip arthroplasty 02/23/2015  . Aortic valve disorder   . Aortic ectasia, unspecified site (HCC)   . Sciatica   . Coronary artery disease (CAD) excluded 03/29/2011  . Aortic dilatation (HCC) 07/18/2010  . HYPERLIPIDEMIA 06/03/2010  . TOBACCO ABUSE 06/03/2010  . Essential hypertension 06/03/2010  . CLAUDICATION, INTERMITTENT 06/03/2010  . PALPITATIONS 06/03/2010  . Shortness of breath 06/03/2010    RAMSEUR,CHRIS, PTA 11/02/2015, 6:18 PM  [facility] [address] [place], [place], [address] Phone: [phone]   Fax:  [phone]  Name: Lucas Curry MRN: [id number] Date of Birth: 20-Jun-1955

## 2015-11-04 ENCOUNTER — Ambulatory Visit: Payer: Medicaid Other | Admitting: *Deleted

## 2015-11-04 DIAGNOSIS — R2689 Other abnormalities of gait and mobility: Secondary | ICD-10-CM

## 2015-11-04 DIAGNOSIS — M25551 Pain in right hip: Secondary | ICD-10-CM

## 2015-11-04 NOTE — Therapy (Signed)
[facility] [address] [place], [place], [address] Phone: [phone]   Fax:  [phone]  Physical Therapy Treatment  Patient Details  Name: Lucas Curry MRN: [id number] Date of Birth: June 27, 1955 Referring Provider: Levonne LappingMaxwell Langfitt MD  Encounter Date: 11/04/2015      PT End of Session - 11/04/15 0954    Visit Number 4   Number of Visits 12   Date for PT Re-Evaluation 11/11/15   PT Start Time 0945   PT Stop Time 1036   PT Time Calculation (min) 51 min      Past Medical History:  Diagnosis Date  . Abdominal pain    chronic secondary to scar  . Aortic ectasia, unspecified site [facility](HCC)    Aortic dilatation  . Aortic valve disorders    moderate to severe aortic insufficiency both by catheterization and echocardiogram.  Ejection fraction 55-60%, low cardiac output.  Cardiac catheterization 1212 calculated valve area 1.14 cm made the abnormal secondary to aortic insufficiency and no significant coronary artery disease.  Normal filling pressures without significant pulmonary hypertension  . Chronic pain due to trauma    patient walks with a limp status post pinning of his right leg  . Coronary artery disease (CAD) excluded    Status post cardiac catheterization  . ETOH abuse   . Heart murmur   . Hypertension   . Limb pain   . Palpitations   . Peripheral vascular disease, unspecified (HCC)    normal ABIs 2012  . Problems with hearing   . Sciatica   . 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 Procedure January 2012    Past Surgical History:  Procedure Laterality Date  . ABDOMINAL SURGERY  1981   due trauma reports that he has pins and some in chest  . KNEE AND LEG orif Right 1984   TRAUMA  . LEFT AND RIGHT HEART CATHETERIZATION WITH CORONARY ANGIOGRAM N/A 03/08/2011   Procedure: LEFT AND RIGHT  HEART CATHETERIZATION WITH CORONARY ANGIOGRAM;  Surgeon: Iran OuchMuhammad A Arida, MD;  [facility]: [facility] C[facility] LAB;  Service: Cardiovascular;  Laterality: N/A;  . LUMBAR FUSION  2015  . plastic surgery right eye  1981  . RIGHT FOREARM ORIF  1981   TRAUMA  . TOTAL HIP ARTHROPLASTY Left 101-22-202016  . TOTAL HIP ARTHROPLASTY Left 101-22-202016   Procedure: TOTAL HIP ARTHROPLASTY;  Surgeon: Valeria BatmanPeter W Whitfield, MD;  [facility];  Service: Orthopedics;  Laterality: Left;  . WISDOM TOOTH EXTRACTION      There were no vitals filed for this visit.      Subjective Assessment - 11/04/15 0948    Subjective Sore all over today due to being on a ladder yesterday trying to help my mom.   Pertinent History RT THR 08-24-15, LT THR 11-16, LB fusion   Limitations Walking;Standing   Currently in Pain? Yes   Pain Score 8    Pain Location Hip   Pain Orientation Right   Pain Descriptors / Indicators Aching;Sore   Pain Onset 1 to 4 weeks ago                         [facility] Adult [facility] Treatment/Exercise - 11/04/15 0001      Exercises   Exercises Knee/Hip     Knee/Hip Exercises: Aerobic   Nustep within hip precautions seat 11 level 5 x 15minutes  Knee/Hip Exercises: Standing   Hip Flexion AROM;Both;3 sets;10 reps   Hip ADduction Right;3 sets;10 reps;5 sets;2 sets   Rocker Board 5 minutes    calf stretching andbalance     Knee/Hip Exercises: Seated   Long Arc Quad Strengthening;Right;4 sets;10 reps   Long Arc Quad Weight 3 lbs.   Clamshell with TheraBand Red  3x10   Sit to Sand 3 sets;10 reps;with UE support  Raised mat table                                   Standing Hip flexor stretching in doorway x3 each side                 PT Long Term Goals - 10/27/15 1114      PT LONG TERM GOAL #1   Title Ind with a HEP.   Status On-going     PT LONG TERM GOAL #2   Title Right hip strength improved to 4+/5   Baseline Right hip abduction= 3/5.   Status On-going     PT LONG TERM  GOAL #3   Title Normalize gait pattern.   Baseline Antalgic gait pattern with multiple deviations.   Status On-going     PT LONG TERM GOAL #4   Title Perform ADL's with pain not > 3/10.   Baseline Patient's pain rises to 9/10 with ADL's, sitting and standing.   Status On-going               Plan - 11/04/15 0955    Clinical Impression Statement Pt arrived to clinic today with increased RT hip soreness due to painting at home yesterday and being on a ladder. Pt did well with today's Rx, but needs cuing for technique during Exs due to compensating. Pt needs to focus on firing glutes and quads during sit to stand  to improve his movement patterns. Goals are ongoing. He was able to ambulate with less flexion today and has more awareness about his posture. He was also instructed in standing Hip flexor stretching to helpwith this.   Rehab Potential Excellent   PT Frequency 2x / week   PT Duration 6 weeks   PT Treatment/Interventions ADLs/Self Care Home Management;Cryotherapy;Electrical Stimulation;Moist Heat;Therapeutic exercise;Therapeutic activities;Gait training;Patient/family education;Manual techniques   PT Next Visit Plan Nustep; gait training; right LE strengthening, assess HEP   Consulted and Agree with Plan of Care Patient      Patient will benefit from skilled therapeutic intervention in order to improve the following deficits and impairments:  Abnormal gait  Visit Diagnosis: Other abnormalities of gait and mobility  Pain in right hip     Problem List Patient Active Problem List   Diagnosis Date Noted  . Avascular necrosis of left femoral head (HCC) 1November 23, 202016  . S/P total hip arthroplasty 1November 23, 202016  . Aortic valve disorder   . Aortic ectasia, unspecified site (HCC)   . Sciatica   . Coronary artery disease (CAD) excluded 03/29/2011  . Aortic dilatation (HCC) 07/18/2010  . HYPERLIPIDEMIA 06/03/2010  . TOBACCO ABUSE 06/03/2010  . Essential hypertension 06/03/2010   . CLAUDICATION, INTERMITTENT 06/03/2010  . PALPITATIONS 06/03/2010  . Shortness of breath 06/03/2010    Mekhi Lascola,CHRIS, PTA 11/04/2015, 10:46 AM  [facility] [address] [place], [place], [address] Phone: [phone]   Fax:  [phone]  Name: Lucas Curry MRN: [id number] Date of Birth: [birth year]

## 2015-11-09 ENCOUNTER — Ambulatory Visit: Payer: Medicaid Other | Admitting: Physical Therapy

## 2015-11-09 ENCOUNTER — Encounter: Payer: Self-pay | Admitting: Physical Therapy

## 2015-11-09 DIAGNOSIS — R2689 Other abnormalities of gait and mobility: Secondary | ICD-10-CM | POA: Diagnosis not present

## 2015-11-09 DIAGNOSIS — M25551 Pain in right hip: Secondary | ICD-10-CM

## 2015-11-09 NOTE — Therapy (Signed)
[facility] [address] [place], [place], [address] Phone: [phone]   Fax:  [phone]  Physical Therapy Treatment  Patient Details  Name: Lucas Curry MRN: [id number] Date of Birth: 08-Feb-1956 Referring Provider: Levonne Lapping MD  Encounter Date: 11/09/2015      PT End of Session - 11/09/15 0823    Visit Number 5   Number of Visits 12   Date for PT Re-Evaluation 11/11/15   PT Start Time 0822   PT Stop Time 0901  2 units secondary to late arrival   PT Time Calculation (min) 39 min   Activity Tolerance Patient tolerated treatment well   Behavior During Therapy [facility] for tasks assessed/performed      Past Medical History:  Diagnosis Date  . Abdominal pain    chronic secondary to scar  . Aortic ectasia, unspecified site [facility])    Aortic dilatation  . Aortic valve disorders    moderate to severe aortic insufficiency both by catheterization and echocardiogram.  Ejection fraction 55-60%, low cardiac output.  Cardiac catheterization 1212 calculated valve area 1.14 cm made the abnormal secondary to aortic insufficiency and no significant coronary artery disease.  Normal filling pressures without significant pulmonary hypertension  . Chronic pain due to trauma    patient walks with a limp status post pinning of his right leg  . Coronary artery disease (CAD) excluded    Status post cardiac catheterization  . ETOH abuse   . Heart murmur   . Hypertension   . Limb pain   . Palpitations   . Peripheral vascular disease, unspecified (HCC)    normal ABIs 2012  . Problems with hearing   . Sciatica   . 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 Procedure January 2012    Past Surgical History:  Procedure Laterality Date  . ABDOMINAL SURGERY  1981   due trauma reports that he has pins and some in chest   . KNEE AND LEG orif Right 1984   TRAUMA  . LEFT AND RIGHT HEART CATHETERIZATION WITH CORONARY ANGIOGRAM N/A 03/08/2011   Procedure: LEFT AND RIGHT HEART CATHETERIZATION WITH CORONARY ANGIOGRAM;  Surgeon: Iran Ouch, MD;  Location: [facility] CATH [facility];  Service: Cardiovascular;  Laterality: N/A;  . LUMBAR FUSION  2015  . plastic surgery right eye  1981  . RIGHT FOREARM ORIF  1981   TRAUMA  . TOTAL HIP ARTHROPLASTY Left 02/23/2015  . TOTAL HIP ARTHROPLASTY Left 02/23/2015   Procedure: TOTAL HIP ARTHROPLASTY;  Surgeon: Valeria Batman, MD;  Location: [facility] OR;  Service: Orthopedics;  Laterality: Left;  . WISDOM TOOTH EXTRACTION      There were no vitals filed for this visit.      Subjective Assessment - 11/09/15 0823    Subjective Reports that it was rough getting started this morning.   Pertinent History RT THR 08-24-15, LT THR 11-16, LB fusion   Limitations Walking;Standing   Currently in Pain? Yes   Pain Score --  "a little" but gave no numerical pain rating   Pain Location Hip   Pain Orientation Right   Pain Type Surgical pain   Pain Onset 1 to 4 weeks ago            [facility] PT Assessment - 11/09/15 0001      Assessment   Medical Diagnosis Hip arthritis   Onset Date/Surgical Date 08/24/15  Hand Dominance Right     Precautions   Precaution Comments Abide by hip precautions.  "Doctor told me to not bear too much weight."     Restrictions   Weight Bearing Restrictions Yes   Other Position/Activity Restrictions Please see under "precautions."                     [facility] Adult PT Treatment/Exercise - 11/09/15 0001      Knee/Hip Exercises: Aerobic   Nustep within hip precautions seat 13 level 5 x 17 minutes     Knee/Hip Exercises: Standing   Hip Flexion AROM;Both;2 sets;10 reps;Knee bent   Forward Lunges Both;1 set;10 reps;5 seconds  for hip flexor stretch   Hip Abduction AROM;Both;2 sets;10 reps;Knee straight   Forward Step Up Both;2 sets;10 reps;Hand  Hold: 2;Step Height: 4"   Rocker Board 3 minutes                     PT Long Term Goals - 10/27/15 1114      PT LONG TERM GOAL #1   Title Ind with a HEP.   Status On-going     PT LONG TERM GOAL #2   Title Right hip strength improved to 4+/5   Baseline Right hip abduction= 3/5.   Status On-going     PT LONG TERM GOAL #3   Title Normalize gait pattern.   Baseline Antalgic gait pattern with multiple deviations.   Status On-going     PT LONG TERM GOAL #4   Title Perform ADL's with pain not > 3/10.   Baseline Patient's pain rises to 9/10 with ADL's, sitting and standing.   Status On-going               Plan - 11/09/15 0912    Clinical Impression Statement Patient arrived to treatment with reports of having difficulty this morning. Patient required moderate multimodal cueing for proper exercise technique and any corrections. Patient continues to ambulate with L trunk lean and antalgic gait with decreased stance time on RLE. Patient ambulated into clinic with one loftstrand crutch today. Patient able to complete small forward step ups with increased time to complete to BLE although B hip ER noted with step ups and NuStep.   Rehab Potential Excellent   PT Frequency 2x / week   PT Duration 6 weeks   PT Treatment/Interventions ADLs/Self Care Home Management;Cryotherapy;Electrical Stimulation;Moist Heat;Therapeutic exercise;Therapeutic activities;Gait training;Patient/family education;Manual techniques   PT Next Visit Plan Nustep; gait training; right LE strengthening, assess HEP   Consulted and Agree with Plan of Care Patient      Patient will benefit from skilled therapeutic intervention in order to improve the following deficits and impairments:  Abnormal gait  Visit Diagnosis: Other abnormalities of gait and mobility  Pain in right hip     Problem List Patient Active Problem List   Diagnosis Date Noted  . Avascular necrosis of left femoral head (HCC)  103-01-202016  . S/P total hip arthroplasty 103-01-202016  . Aortic valve disorder   . Aortic ectasia, unspecified site (HCC)   . Sciatica   . Coronary artery disease (CAD) excluded 03/29/2011  . Aortic dilatation (HCC) 07/18/2010  . HYPERLIPIDEMIA 06/03/2010  . TOBACCO ABUSE 06/03/2010  . Essential hypertension 06/03/2010  . CLAUDICATION, INTERMITTENT 06/03/2010  . PALPITATIONS 06/03/2010  . Shortness of breath 06/03/2010    Evelene CroonKelsey M Parsons, PTA 11/09/2015, 9:18 AM  [facility] [address] [place], [place], [address]  Phone: [phone]   Fax:  [phone]  Name: Lucas Curry MRN: [id number] Date of Birth: March 27, 1956

## 2015-11-11 ENCOUNTER — Ambulatory Visit: Payer: Medicaid Other | Admitting: *Deleted

## 2015-11-11 DIAGNOSIS — R2689 Other abnormalities of gait and mobility: Secondary | ICD-10-CM | POA: Diagnosis not present

## 2015-11-11 DIAGNOSIS — M25551 Pain in right hip: Secondary | ICD-10-CM

## 2015-11-11 NOTE — Therapy (Signed)
[facility] [address] [place], [place], [address] Phone: [phone]   Fax:  [phone]  Physical Therapy Treatment  Patient Details  Name: Lucas Curry MRN: [id number] Date of Birth: Jul 27, 1955 Referring Provider: Levonne LappingMaxwell Langfitt MD  Encounter Date: 11/11/2015      PT End of Session - 11/11/15 0828    Visit Number 6   Number of Visits 12   Date for PT Re-Evaluation 11/11/15   PT Start Time 0815   PT Stop Time 0904   PT Time Calculation (min) 49 min      Past Medical History:  Diagnosis Date  . Abdominal pain    chronic secondary to scar  . Aortic ectasia, unspecified site [facility](HCC)    Aortic dilatation  . Aortic valve disorders    moderate to severe aortic insufficiency both by catheterization and echocardiogram.  Ejection fraction 55-60%, low cardiac output.  Cardiac catheterization 1212 calculated valve area 1.14 cm made the abnormal secondary to aortic insufficiency and no significant coronary artery disease.  Normal filling pressures without significant pulmonary hypertension  . Chronic pain due to trauma    patient walks with a limp status post pinning of his right leg  . Coronary artery disease (CAD) excluded    Status post cardiac catheterization  . ETOH abuse   . Heart murmur   . Hypertension   . Limb pain   . Palpitations   . Peripheral vascular disease, unspecified (HCC)    normal ABIs 2012  . Problems with hearing   . Sciatica   . 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 Procedure January 2012    Past Surgical History:  Procedure Laterality Date  . ABDOMINAL SURGERY  1981   due trauma reports that he has pins and some in chest  . KNEE AND LEG orif Right 1984   TRAUMA  . LEFT AND RIGHT HEART CATHETERIZATION WITH CORONARY ANGIOGRAM N/A 03/08/2011   Procedure: LEFT AND RIGHT  HEART CATHETERIZATION WITH CORONARY ANGIOGRAM;  Surgeon: Iran OuchMuhammad A Arida, MD;  Location: [facility] CATH [facility];  Service: Cardiovascular;  Laterality: N/A;  . LUMBAR FUSION  2015  . plastic surgery right eye  1981  . RIGHT FOREARM ORIF  1981   TRAUMA  . TOTAL HIP ARTHROPLASTY Left 1July 22, 202016  . TOTAL HIP ARTHROPLASTY Left 1July 22, 202016   Procedure: TOTAL HIP ARTHROPLASTY;  Surgeon: Valeria BatmanPeter W Whitfield, MD;  [facility];  Service: Orthopedics;  Laterality: Left;  . WISDOM TOOTH EXTRACTION      There were no vitals filed for this visit.      Subjective Assessment - 11/11/15 0820    Subjective Doing better today. RT hip is less sore   Limitations Walking;Standing   Currently in Pain? Yes   Pain Score 4    Pain Location Hip   Pain Orientation Right   Pain Descriptors / Indicators Aching;Sore   Pain Type Surgical pain                         [facility] Adult PT Treatment/Exercise - 11/11/15 0001      Exercises   Exercises Knee/Hip     Knee/Hip Exercises: Aerobic   Nustep within hip precautions seat 13 level 5 x 17 minutes     Knee/Hip Exercises: Standing   Hip Flexion AROM;Both;10 reps;Knee bent;3 sets   Forward Lunges Both;1 set;10 reps;5  seconds  Hip Flexion stretch   Hip Abduction AROM;Both;10 reps;Knee straight;4 sets   Forward Step Up Both;10 reps;Hand Hold: 2;Step Height: 4";3 sets   Rocker Board 5 minutes  calf stretching and balance     Knee/Hip Exercises: Seated   Long Arc Quad Strengthening;Right;4 sets;10 reps   Long Arc Quad Weight 4 lbs.   Clamshell with TheraBand --   Sit to Sand 3 sets;10 reps;with UE support                     PT Long Term Goals - 11/11/15 0846      PT LONG TERM GOAL #1   Title Ind with a HEP.   Baseline No knowledge of appropriate ther ex.   Time 6   Period Weeks   Status Achieved     PT LONG TERM GOAL #2   Title Right hip strength improved to 4+/5   Baseline Right hip abduction= 3/5.   Time 6   Period Weeks    Status On-going     PT LONG TERM GOAL #3   Title Normalize gait pattern.   Baseline Antalgic gait pattern with multiple deviations.   Time 6   Period Weeks   Status On-going     PT LONG TERM GOAL #4   Title Perform ADL's with pain not > 3/10.   Baseline Patient's pain rises to 9/10 with ADL's, sitting and standing.   Period Weeks   Status On-going               Plan - 11/11/15 0834    Clinical Impression Statement Pt arrived to clinic ambulating  with lofstrand crutch still, but with better erect  posture today. He still Leans LT and has an antalgic pattern, but Pt. states that RT LE is longer. His balance has improved and he was able to meet goal for HEP. Others are ongoing    Rehab Potential Excellent   PT Frequency 2x / week   PT Duration 6 weeks   PT Treatment/Interventions ADLs/Self Care Home Management;Cryotherapy;Electrical Stimulation;Moist Heat;Therapeutic exercise;Therapeutic activities;Gait training;Patient/family education;Manual techniques   PT Next Visit Plan Nustep; gait training; right LE strengthening, assess HEP   Consulted and Agree with Plan of Care Patient      Patient will benefit from skilled therapeutic intervention in order to improve the following deficits and impairments:  Abnormal gait  Visit Diagnosis: Pain in right hip  Other abnormalities of gait and mobility     Problem List Patient Active Problem List   Diagnosis Date Noted  . Avascular necrosis of left femoral head (HCC) 105/16/202016  . S/P total hip arthroplasty 105/16/202016  . Aortic valve disorder   . Aortic ectasia, unspecified site (HCC)   . Sciatica   . Coronary artery disease (CAD) excluded 03/29/2011  . Aortic dilatation (HCC) 07/18/2010  . HYPERLIPIDEMIA 06/03/2010  . TOBACCO ABUSE 06/03/2010  . Essential hypertension 06/03/2010  . CLAUDICATION, INTERMITTENT 06/03/2010  . PALPITATIONS 06/03/2010  . Shortness of breath 06/03/2010    Halli Equihua,CHRIS, PTA 11/11/2015,  9:08 AM  [facility] [address] [place], [place], [address] Phone: [phone]   Fax:  [phone]  Name: Lucas Curry MRN: [id number] Date of Birth: 05-29-1955

## 2015-11-16 ENCOUNTER — Ambulatory Visit: Payer: Medicaid Other | Admitting: *Deleted

## 2015-11-16 DIAGNOSIS — R2689 Other abnormalities of gait and mobility: Secondary | ICD-10-CM | POA: Diagnosis not present

## 2015-11-16 DIAGNOSIS — M25551 Pain in right hip: Secondary | ICD-10-CM

## 2015-11-16 NOTE — Therapy (Signed)
[facility] [address] [place], [place], [address] Phone: [phone]   Fax:  [phone]  Physical Therapy Treatment  Patient Details  Name: Lucas LikensMarshall R Scheper MRN: [id number] Date of Birth: 1955-08-24 Referring Provider: Levonne LappingMaxwell Langfitt MD  Encounter Date: 11/16/2015      PT End of Session - 11/16/15 1436    Visit Number 7   Number of Visits 12   Date for PT Re-Evaluation 11/11/15   PT Start Time 1430   PT Stop Time 1521   PT Time Calculation (min) 51 min      Past Medical History:  Diagnosis Date  . Abdominal pain    chronic secondary to scar  . Aortic ectasia, unspecified site [facility](HCC)    Aortic dilatation  . Aortic valve disorders    moderate to severe aortic insufficiency both by catheterization and echocardiogram.  Ejection fraction 55-60%, low cardiac output.  Cardiac catheterization 1212 calculated valve area 1.14 cm made the abnormal secondary to aortic insufficiency and no significant coronary artery disease.  Normal filling pressures without significant pulmonary hypertension  . Chronic pain due to trauma    patient walks with a limp status post pinning of his right leg  . Coronary artery disease (CAD) excluded    Status post cardiac catheterization  . ETOH abuse   . Heart murmur   . Hypertension   . Limb pain   . Palpitations   . Peripheral vascular disease, unspecified (HCC)    normal ABIs 2012  . Problems with hearing   . Sciatica   . 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 Procedure January 2012    Past Surgical History:  Procedure Laterality Date  . ABDOMINAL SURGERY  1981   due trauma reports that he has pins and some in chest  . KNEE AND LEG orif Right 1984   TRAUMA  . LEFT AND RIGHT HEART CATHETERIZATION WITH CORONARY ANGIOGRAM N/A 03/08/2011   Procedure: LEFT AND RIGHT  HEART CATHETERIZATION WITH CORONARY ANGIOGRAM;  Surgeon: Iran OuchMuhammad A Arida, MD;  [facility];  Service: Cardiovascular;  Laterality: N/A;  . LUMBAR FUSION  2015  . plastic surgery right eye  1981  . RIGHT FOREARM ORIF  1981   TRAUMA  . TOTAL HIP ARTHROPLASTY Left 02/23/2015  . TOTAL HIP ARTHROPLASTY Left 02/23/2015   Procedure: TOTAL HIP ARTHROPLASTY;  Surgeon: Valeria BatmanPeter W Whitfield, MD;  [facility];  Service: Orthopedics;  Laterality: Left;  . WISDOM TOOTH EXTRACTION      There were no vitals filed for this visit.      Subjective Assessment - 11/16/15 1439    Subjective Having more soreness today after standing a lot yesterday.    TO MD on Monday   Pertinent History RT THR 08-24-15, LT THR 11-16, LB fusion   Limitations Walking;Standing   Currently in Pain? Yes   Pain Score 4    Pain Location Hip   Pain Orientation Right   Pain Descriptors / Indicators Aching;Sore   Pain Type Surgical pain   Pain Onset 1 to 4 weeks ago                         [facility] Adult [facility] Treatment/Exercise - 11/16/15 0001      Exercises   Exercises Knee/Hip     Knee/Hip Exercises: Aerobic   Nustep within hip precautions  seat 13 level 5 x 17 minutes     Knee/Hip Exercises: Standing   Hip Flexion AROM;Both;4 sets;10 reps   Hip Abduction AROM;Both;10 reps;Knee straight;4 sets   Forward Step Up Both;10 reps;Hand Hold: 2;3 sets;Step Height: 6"  toe taps   Rocker Board 5 minutes     Knee/Hip Exercises: Seated   Long Arc Quad Strengthening;Right;3 sets;15 reps   Long Arc Quad Weight 4 lbs.                     PT Long Term Goals - 11/11/15 0846      PT LONG TERM GOAL #1   Title Ind with a HEP.   Baseline No knowledge of appropriate ther ex.   Time 6   Period Weeks   Status Achieved     PT LONG TERM GOAL #2   Title Right hip strength improved to 4+/5   Baseline Right hip abduction= 3/5.   Time 6   Period Weeks   Status On-going     PT LONG TERM GOAL #3    Title Normalize gait pattern.   Baseline Antalgic gait pattern with multiple deviations.   Time 6   Period Weeks   Status On-going     PT LONG TERM GOAL #4   Title Perform ADL's with pain not > 3/10.   Baseline Patient's pain rises to 9/10 with ADL's, sitting and standing.   Period Weeks   Status On-going               Plan - 11/16/15 1507    Clinical Impression Statement Pt arrived at clinic with more soreness in RT hip today. He feels it is from standing a lot at home yesterday helping his mom in the kitchen. We raised his crutch ht. one notch today to help with  flexed posture during gait.  Balance was easier for Pt  today  and only needed SBA.   Rehab Potential Excellent   PT Frequency 2x / week   PT Duration 6 weeks   PT Treatment/Interventions ADLs/Self Care Home Management;Cryotherapy;Electrical Stimulation;Moist Heat;Therapeutic exercise;Therapeutic activities;Gait training;Patient/family education;Manual techniques   PT Next Visit Plan Nustep; gait training; right LE strengthening, assess HEP   Consulted and Agree with Plan of Care Patient      Patient will benefit from skilled therapeutic intervention in order to improve the following deficits and impairments:  Abnormal gait  Visit Diagnosis: Pain in right hip  Other abnormalities of gait and mobility     Problem List Patient Active Problem List   Diagnosis Date Noted  . Avascular necrosis of left femoral head (HCC) 1Oct 10, 202016  . S/P total hip arthroplasty 1Oct 10, 202016  . Aortic valve disorder   . Aortic ectasia, unspecified site (HCC)   . Sciatica   . Coronary artery disease (CAD) excluded 03/29/2011  . Aortic dilatation (HCC) 07/18/2010  . HYPERLIPIDEMIA 06/03/2010  . TOBACCO ABUSE 06/03/2010  . Essential hypertension 06/03/2010  . CLAUDICATION, INTERMITTENT 06/03/2010  . PALPITATIONS 06/03/2010  . Shortness of breath 06/03/2010    RAMSEUR,CHRIS, PTA 11/16/2015, 3:26 PM  [facility] [address] [place], [place], [address] Phone: [phone]   Fax:  [phone]  Name: Lucas LikensMarshall R Dulude MRN: [id number] Date of Birth: 1956/01/23

## 2015-11-18 ENCOUNTER — Ambulatory Visit: Payer: Medicaid Other | Admitting: *Deleted

## 2015-11-18 DIAGNOSIS — M25551 Pain in right hip: Secondary | ICD-10-CM

## 2015-11-18 DIAGNOSIS — R2689 Other abnormalities of gait and mobility: Secondary | ICD-10-CM

## 2015-11-18 NOTE — Therapy (Signed)
[facility] [place], [place], [address] Phone: [phone]   Fax:  [phone]  Physical Therapy Treatment  Patient Details  Name: Lucas Curry MRN: [id number] Date of Birth: Apr 22, 1955 Referring Provider: Vela Prose MD  Encounter Date: 11/18/2015      PT End of Session - 11/18/15 1501    Visit Number 8   Number of Visits 12   Date for PT Re-Evaluation 11/11/15   PT Start Time 1430   PT Stop Time 1520   PT Time Calculation (min) 50 min      Past Medical History:  Diagnosis Date  . Abdominal pain    chronic secondary to scar  . Aortic ectasia, unspecified site [facility])    Aortic dilatation  . Aortic valve disorders    moderate to severe aortic insufficiency both by catheterization and echocardiogram.  Ejection fraction 55-60%, low cardiac output.  Cardiac catheterization 1212 calculated valve area 1.14 cm made the abnormal secondary to aortic insufficiency and no significant coronary artery disease.  Normal filling pressures without significant pulmonary hypertension  . Chronic pain due to trauma    patient walks with a limp status post pinning of his right leg  . Coronary artery disease (CAD) excluded    Status post cardiac catheterization  . ETOH abuse   . Heart murmur   . Hypertension   . Limb pain   . Palpitations   . Peripheral vascular disease, unspecified ([place])    normal ABIs 2012  . Problems with hearing   . Sciatica   . 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 Procedure January 2012    Past Surgical History:  Procedure Laterality Date  . ABDOMINAL SURGERY  1981   due trauma reports that he has pins and some in chest  . KNEE AND LEG orif Right 1984   TRAUMA  . LEFT AND RIGHT HEART CATHETERIZATION WITH CORONARY ANGIOGRAM N/A 03/08/2011   Procedure: LEFT AND RIGHT  HEART CATHETERIZATION WITH CORONARY ANGIOGRAM;  Surgeon: Wellington Hampshire, MD;  Location: [place] CATH LAB;  Service: Cardiovascular;  Laterality: N/A;  . LUMBAR FUSION  2015  . plastic surgery right eye  1981  . RIGHT FOREARM ORIF  1981   TRAUMA  . TOTAL HIP ARTHROPLASTY Left 101-02-202016  . TOTAL HIP ARTHROPLASTY Left 101-02-202016   Procedure: TOTAL HIP ARTHROPLASTY;  Surgeon: Garald Balding, MD;  Location: [place];  Service: Orthopedics;  Laterality: Left;  . WISDOM TOOTH EXTRACTION      There were no vitals filed for this visit.      Subjective Assessment - 11/18/15 1459    Subjective Having more soreness today after standing a lot yesterday.    TO MD on Monday 2:00   Pertinent History RT THR 08-24-15, LT THR 11-16, LB fusion   Limitations Walking;Standing   Currently in Pain? Yes   Pain Score 7    Pain Location Hip   Pain Orientation Right   Pain Descriptors / Indicators Aching;Sore   Pain Type Surgical pain   Pain Onset 1 to 4 weeks ago                         [facility] Adult PT Treatment/Exercise - 11/18/15 0001      Exercises   Exercises Knee/Hip     Knee/Hip Exercises: Aerobic   Nustep within hip  precautions seat 13 level 5 x 17 minutes     Knee/Hip Exercises: Standing   Hip Flexion AROM;Both;2 sets;10 reps  6 in step toe touches for balance/ wt shift   Hip Abduction AROM;Both;10 reps;Knee straight;4 sets   Forward Step Up Both;Hand Hold: 2;Step Height: 6";2 sets;15 reps   Rocker Board 5 minutes     Knee/Hip Exercises: Seated   Long Arc Quad Strengthening;Right;3 sets;15 reps   Long Arc Quad Weight 4 lbs.   Sit to Sand 3 sets;10 reps;with UE support                     PT Long Term Goals - 11/18/15 1505      PT LONG TERM GOAL #2   Title Right hip strength improved to 4+/5   Baseline Right hip abduction= 3/5.   Period Weeks   Status On-going     PT LONG TERM GOAL #3   Title Normalize gait pattern.   Baseline Antalgic gait pattern  with multiple deviations.   Time 6   Period Weeks   Status On-going     PT LONG TERM GOAL #4   Title Perform ADL's with pain not > 3/10.   Baseline Patient's pain rises to 9/10 with ADL's, sitting and standing.   Time 6   Period Weeks   Status On-going               Plan - 11/18/15 1502    Clinical Impression Statement Pt was doing better today with less soreness in RT hip and was able to complete all Exs and Act.'s with minimal increase in pain. He has improved RT hip strength to 4/10 now and is able to walk and stand longer. He still hjas a deviated gait due to pain, tightness,  and weakness in both hips. Goals not met today due to these deficits.   Rehab Potential Excellent   PT Frequency 2x / week   PT Duration 6 weeks   PT Treatment/Interventions ADLs/Self Care Home Management;Cryotherapy;Electrical Stimulation;Moist Heat;Therapeutic exercise;Therapeutic activities;Gait training;Patient/family education;Manual techniques   PT Next Visit Plan Nustep; gait training; right LE strengthening,  DC after 1 more visit due to insrance limits then Pt to join GYm program. Send MD note   Consulted and Agree with Plan of Care Patient      Patient will benefit from skilled therapeutic intervention in order to improve the following deficits and impairments:  Abnormal gait  Visit Diagnosis: Pain in right hip  Other abnormalities of gait and mobility     Problem List Patient Active Problem List   Diagnosis Date Noted  . Avascular necrosis of left femoral head ([place]) 111-Aug-202016  . S/P total hip arthroplasty 111-Aug-202016  . Aortic valve disorder   . Aortic ectasia, unspecified site ([facility])   . Sciatica   . Coronary artery disease (CAD) excluded 03/29/2011  . Aortic dilatation ([place]) 07/18/2010  . HYPERLIPIDEMIA 06/03/2010  . TOBACCO ABUSE 06/03/2010  . Essential hypertension 06/03/2010  . CLAUDICATION, INTERMITTENT 06/03/2010  . PALPITATIONS 06/03/2010  . Shortness of breath  06/03/2010    Curry, Lucas , PTA 11/18/2015, 5:04 PM Lucas Curry MPT [facility] [address] [place], [place], [address] Phone: [phone]   Fax:  [phone]  Name: Lucas Curry MRN: [id number] Date of Birth: 04-09-1955

## 2015-11-23 ENCOUNTER — Ambulatory Visit: Payer: Medicaid Other | Admitting: Physical Therapy

## 2015-11-23 DIAGNOSIS — R2689 Other abnormalities of gait and mobility: Secondary | ICD-10-CM

## 2015-11-23 DIAGNOSIS — M25551 Pain in right hip: Secondary | ICD-10-CM

## 2015-11-23 NOTE — Therapy (Signed)
[facility] [place], [place], [address] Phone: [phone]   Fax:  [phone]  Physical Therapy Treatment  Patient Details  Name: Lucas Curry MRN: [id number] Date of Birth: 1956-02-16 Referring Provider: Vela Prose MD  Encounter Date: 11/23/2015      PT End of Session - 11/23/15 1203    Visit Number 9   Number of Visits 12   Date for PT Re-Evaluation 11/11/15   PT Start Time 1115   PT Stop Time 1159   PT Time Calculation (min) 44 min   Activity Tolerance Patient tolerated treatment well   Behavior During Therapy [facility] for tasks assessed/performed      Past Medical History:  Diagnosis Date  . Abdominal pain    chronic secondary to scar  . Aortic ectasia, unspecified site [facility])    Aortic dilatation  . Aortic valve disorders    moderate to severe aortic insufficiency both by catheterization and echocardiogram.  Ejection fraction 55-60%, low cardiac output.  Cardiac catheterization 1212 calculated valve area 1.14 cm made the abnormal secondary to aortic insufficiency and no significant coronary artery disease.  Normal filling pressures without significant pulmonary hypertension  . Chronic pain due to trauma    patient walks with a limp status post pinning of his right leg  . Coronary artery disease (CAD) excluded    Status post cardiac catheterization  . ETOH abuse   . Heart murmur   . Hypertension   . Limb pain   . Palpitations   . Peripheral vascular disease, unspecified ([place])    normal ABIs 2012  . Problems with hearing   . Sciatica   . 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 Procedure January 2012    Past Surgical History:  Procedure Laterality Date  . ABDOMINAL SURGERY  1981   due trauma reports that he has pins and some in chest  . KNEE AND LEG orif Right 1984   TRAUMA  . LEFT AND RIGHT HEART CATHETERIZATION WITH CORONARY ANGIOGRAM N/A 03/08/2011   Procedure: LEFT AND RIGHT HEART CATHETERIZATION WITH CORONARY ANGIOGRAM;  Surgeon: Wellington Hampshire, MD;  Location: [place] CATH LAB;  Service: Cardiovascular;  Laterality: N/A;  . LUMBAR FUSION  2015  . plastic surgery right eye  1981  . RIGHT FOREARM ORIF  1981   TRAUMA  . TOTAL HIP ARTHROPLASTY Left 101-10-2014  . TOTAL HIP ARTHROPLASTY Left 101-10-2014   Procedure: TOTAL HIP ARTHROPLASTY;  Surgeon: Garald Balding, MD;  Location: [place];  Service: Orthopedics;  Laterality: Left;  . WISDOM TOOTH EXTRACTION      There were no vitals filed for this visit.      Subjective Assessment - 11/23/15 1130    Subjective I overdid it yesterday weedeating and such. I have come a long way with therapy.   Pertinent History RT THR 08-24-15, LT THR 11-16, LB fusion   Limitations Walking;Standing   Currently in Pain? Yes   Pain Score 8    Pain Orientation Right   Pain Descriptors / Indicators Aching;Sore   Pain Type Surgical pain   Pain Onset More than a month ago            [facility] PT Assessment - 11/23/15 0001      Strength   Overall Strength Comments R hip flex 5/5; ABD 5/5 in available range  [place] Adult PT Treatment/Exercise - 11/23/15 0001      Knee/Hip Exercises: Aerobic   Nustep within hip precautions seat 13 level 5 x 16 minutes     Knee/Hip Exercises: Standing   Other Standing Knee Exercises side stepping on level surface x 20             Balance Exercises - 11/23/15 1149      Balance Exercises: Standing   Standing Eyes Opened Foam/compliant surface;Head turns;Time   Tandem Gait Intermittent upper extremity support;Foam/compliant surface;Forward;Retro   Sidestepping Foam/compliant support                PT Long Term Goals - 11/23/15 1132      PT LONG TERM GOAL #1   Title Ind with a HEP.   Baseline No knowledge of appropriate ther ex.    Time 6   Period Weeks   Status Achieved     PT LONG TERM GOAL #2   Title Right hip strength improved to 4+/5   Baseline Right hip abduction= 3/5.   Time 6   Period Weeks   Status Achieved     PT LONG TERM GOAL #3   Title Normalize gait pattern.   Baseline Antalgic gait pattern with multiple deviations.   Time 6   Period Weeks   Status Not Met     PT LONG TERM GOAL #4   Title Perform ADL's with pain not > 3/10.   Baseline 9/10 with ADLS   Time 6   Period Weeks   Status Not Met               Plan - 11/23/15 1207    Clinical Impression Statement Patient presented today with increased pain/soreness in R hip and low back reportedly from overdoing it with yard work. He reports significant improvement since starting PT and is ready to D/C. He has met 2/4 LTGs and plans to continue to work toward goals in the gym program. Pt introduced new balance activities to add to his strenghening exercises. Weight shifting onto RLE was also stressed to help with gait deviations.   Rehab Potential Excellent   PT Frequency 2x / week   PT Treatment/Interventions ADLs/Self Care Home Management;Cryotherapy;Electrical Stimulation;Moist Heat;Therapeutic exercise;Therapeutic activities;Gait training;Patient/family education;Manual techniques   PT Next Visit Plan see D/C summary   Consulted and Agree with Plan of Care Patient      Patient will benefit from skilled therapeutic intervention in order to improve the following deficits and impairments:  Abnormal gait  Visit Diagnosis: Pain in right hip  Other abnormalities of gait and mobility     Problem List Patient Active Problem List   Diagnosis Date Noted  . Avascular necrosis of left femoral head ([place]) 1January 02, 202016  . S/P total hip arthroplasty 1January 02, 202016  . Aortic valve disorder   . Aortic ectasia, unspecified site ([place])   . Sciatica   . Coronary artery disease (CAD) excluded 03/29/2011  . Aortic dilatation ([place]) 07/18/2010  .  HYPERLIPIDEMIA 06/03/2010  . TOBACCO ABUSE 06/03/2010  . Essential hypertension 06/03/2010  . CLAUDICATION, INTERMITTENT 06/03/2010  . PALPITATIONS 06/03/2010  . Shortness of breath 06/03/2010    Madelyn Flavors PT 11/23/2015, 12:13 PM  [facility] [place], [place], [address] Phone: [phone]   Fax:  [phone]  Name: Lucas Curry MRN: [id number] Date of Birth: 1955/11/27   PHYSICAL THERAPY DISCHARGE SUMMARY  Visits from Start of Care: 9  Current functional level related to  goals / functional outcomes: See above   Remaining deficits: See above   Education / Equipment: HEP Plan: Patient agrees to discharge.  Patient goals were partially met. Patient is being discharged due to being pleased with the current functional level.  ?????    Madelyn Flavors, PT 11/23/15 12:14 PM

## 2015-12-13 ENCOUNTER — Other Ambulatory Visit: Payer: Self-pay | Admitting: Cardiovascular Disease

## 2015-12-13 DIAGNOSIS — I359 Nonrheumatic aortic valve disorder, unspecified: Secondary | ICD-10-CM

## 2016-01-05 ENCOUNTER — Ambulatory Visit (INDEPENDENT_AMBULATORY_CARE_PROVIDER_SITE_OTHER): Payer: Medicaid Other

## 2016-01-05 ENCOUNTER — Other Ambulatory Visit: Payer: Self-pay

## 2016-01-05 DIAGNOSIS — I359 Nonrheumatic aortic valve disorder, unspecified: Secondary | ICD-10-CM | POA: Diagnosis not present

## 2016-02-07 ENCOUNTER — Ambulatory Visit: Payer: Medicaid Other | Admitting: Cardiovascular Disease

## 2016-03-07 ENCOUNTER — Other Ambulatory Visit: Payer: Self-pay | Admitting: Cardiovascular Disease

## 2016-06-07 ENCOUNTER — Ambulatory Visit (INDEPENDENT_AMBULATORY_CARE_PROVIDER_SITE_OTHER): Payer: Medicaid Other

## 2016-06-07 ENCOUNTER — Encounter (INDEPENDENT_AMBULATORY_CARE_PROVIDER_SITE_OTHER): Payer: Self-pay | Admitting: Orthopaedic Surgery

## 2016-06-07 ENCOUNTER — Ambulatory Visit (INDEPENDENT_AMBULATORY_CARE_PROVIDER_SITE_OTHER): Payer: Medicaid Other | Admitting: Orthopaedic Surgery

## 2016-06-07 VITALS — BP 149/83 | HR 73 | Resp 14 | Ht 68.0 in | Wt 193.0 lb

## 2016-06-07 DIAGNOSIS — G8929 Other chronic pain: Secondary | ICD-10-CM | POA: Diagnosis not present

## 2016-06-07 DIAGNOSIS — M545 Low back pain: Secondary | ICD-10-CM

## 2016-06-07 NOTE — Progress Notes (Signed)
Office Visit Note   Patient: Lucas Curry           Date of Birth: 06/18/1955           MRN: [id number] Visit Date: 06/07/2016              Requested by: Avon Gully, MD [address] [place], [place] [address] PCP: Avon Gully, MD   Assessment & Plan: Visit Diagnoses:  1. Chronic bilateral low back pain, with sciatica presence unspecified   S/P bilateral THR's  Plan: wt loss,exercises fro LE strengthening  Follow-Up Instructions: No Follow-up on file.   Orders:  Orders Placed This Encounter  Procedures  . XR HIPS BILAT W OR W/O PELVIS 2V   No orders of the defined types were placed in this encounter.     Procedures: No procedures performed   Clinical Data: No additional findings.   Subjective: Chief Complaint  Patient presents with  . Lower Back - Pain   Denies any hip pain, but has chronic LBP followed by Dr Teryl Lucy with hips HPI  Review of Systems   Objective: Vital Signs: BP (!) 149/83   Pulse 73   Resp 14   Ht 5\' 8"  (1.727 m)   Wt 193 lb (87.5 kg)   BMI 29.35 kg/m   Physical Exam  Ortho Exampainless ROM both hips, motors intact, no LE swelling, mild percussible tenderness L-S spine, leg lengths equal  Specialty Comments:  No specialty comments available.  Imaging: Xr Hips Bilat W Or W/o Pelvis 2v  Result Date: 06/07/2016 AP the pelvis was obtained demonstrating both THR's to be in excellent position. 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    PMFS History: Patient Active Problem List   Diagnosis Date Noted  . Avascular necrosis of left femoral head (HCC) 105/08/2014  . S/P total hip arthroplasty 105/08/2014  . Aortic valve disorder   . Aortic ectasia, unspecified site   . Sciatica   . Coronary artery disease (CAD) excluded 03/29/2011  . Aortic dilatation (HCC) 07/18/2010  . HYPERLIPIDEMIA 06/03/2010  . TOBACCO ABUSE 06/03/2010  . Essential  hypertension 06/03/2010  . CLAUDICATION, INTERMITTENT 06/03/2010  . PALPITATIONS 06/03/2010  . Shortness of breath 06/03/2010   Past Medical History:  Diagnosis Date  . Abdominal pain    chronic secondary to scar  . Aortic ectasia, unspecified site    Aortic dilatation  . Aortic valve disorders    moderate to severe aortic insufficiency both by catheterization and echocardiogram.  Ejection fraction 55-60%, low cardiac output.  Cardiac catheterization 1212 calculated valve area 1.14 cm made the abnormal secondary to aortic insufficiency and no significant coronary artery disease.  Normal filling pressures without significant pulmonary hypertension  . Chronic pain due to trauma    patient walks with a limp status post pinning of his right leg  . Coronary artery disease (CAD) excluded    Status post cardiac catheterization  . ETOH abuse   . Heart murmur   . Hypertension   . Limb pain   . Palpitations   . Peripheral vascular disease, unspecified    normal ABIs 2012  . Problems with hearing   . Sciatica   . 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 Procedure January 2012    No family history on file.  Past Surgical History:  Procedure Laterality Date  .  ABDOMINAL SURGERY  1981   due trauma reports that he has pins and some in chest  . KNEE AND LEG orif Right 1984   TRAUMA  . LEFT AND RIGHT HEART CATHETERIZATION WITH CORONARY ANGIOGRAM N/A 03/08/2011   Procedure: LEFT AND RIGHT HEART CATHETERIZATION WITH CORONARY ANGIOGRAM;  Surgeon: Iran OuchMuhammad A Arida, MD;  [facility];  Service: Cardiovascular;  Laterality: N/A;  . LUMBAR FUSION  2015  . plastic surgery right eye  1981  . RIGHT FOREARM ORIF  1981   TRAUMA  . TOTAL HIP ARTHROPLASTY Left 02/23/2015  . TOTAL HIP ARTHROPLASTY Left 02/23/2015   Procedure: TOTAL HIP ARTHROPLASTY;  Surgeon: Valeria BatmanPeter W  Kincaid Tiger, MD;  [facility];  Service: Orthopedics;  Laterality: Left;  . WISDOM TOOTH EXTRACTION     Social History   Occupational History  . PAINTER    Social History Main Topics  . Smoking status: Former Smoker    Packs/day: 1.00    Years: 6.00    Types: Cigarettes    Start date: 02/03/2007    Quit date: 01/25/2015  . Smokeless tobacco: Former NeurosurgeonUser    Quit date: 10/16/1996  . Alcohol use No  . Drug use: No  . Sexual activity: Not Currently    Partners: Female

## 2016-06-07 NOTE — Progress Notes (Deleted)
Office Visit Note   Patient: Lucas Curry           Date of Birth: 1956-01-05           MRN: [id number] Visit Date: 06/07/2016              Requested by: Lucas Gullyesfaye Fanta, MD [address] [place], [place] [id number] PCP: Lucas GullyFANTA,TESFAYE, MD   Assessment & Plan: Visit Diagnoses: No diagnosis found.  Plan: ***  Follow-Up Instructions: No Follow-up on file.   Orders:  No orders of the defined types were placed in this encounter.  No orders of the defined types were placed in this encounter.     Procedures: No procedures performed   Clinical Data: No additional findings.   Subjective: Chief Complaint  Patient presents with  . Lower Back - Pain    Mr.Lucas Curry is here today for a year follow up from left THA on 2017. Today he also has low back pain that may stem from his ambulating with a cane for a gait problem (per PCP).     Review of Systems   Objective: Vital Signs: BP (!) 149/83   Pulse 73   Resp 14   Ht 5\' 8"  (1.727 m)   Wt 193 lb (87.5 kg)   BMI 29.35 kg/m   Physical Exam  Ortho Exam  Specialty Comments:  No specialty comments available.  Imaging: No results found.   PMFS History: Patient Active Problem List   Diagnosis Date Noted  . Avascular necrosis of left femoral head (HCC) 110-20-202016  . S/P total hip arthroplasty 110-20-202016  . Aortic valve disorder   . Aortic ectasia, unspecified site   . Sciatica   . Coronary artery disease (CAD) excluded 03/29/2011  . Aortic dilatation (HCC) 07/18/2010  . HYPERLIPIDEMIA 06/03/2010  . TOBACCO ABUSE 06/03/2010  . Essential hypertension 06/03/2010  . CLAUDICATION, INTERMITTENT 06/03/2010  . PALPITATIONS 06/03/2010  . Shortness of breath 06/03/2010   Past Medical History:  Diagnosis Date  . Abdominal pain    chronic secondary to scar  . Aortic ectasia, unspecified site    Aortic dilatation  . Aortic valve disorders    moderate to severe aortic insufficiency both by  catheterization and echocardiogram.  Ejection fraction 55-60%, low cardiac output.  Cardiac catheterization 1212 calculated valve area 1.14 cm made the abnormal secondary to aortic insufficiency and no significant coronary artery disease.  Normal filling pressures without significant pulmonary hypertension  . Chronic pain due to trauma    patient walks with a limp status post pinning of his right leg  . Coronary artery disease (CAD) excluded    Status post cardiac catheterization  . ETOH abuse   . Heart murmur   . Hypertension   . Limb pain   . Palpitations   . Peripheral vascular disease, unspecified    normal ABIs 2012  . Problems with hearing   . Sciatica   . 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 Procedure January 2012    No family history on file.  Past Surgical History:  Procedure Laterality Date  . ABDOMINAL SURGERY  1981   due trauma reports that he has pins and some in chest  . KNEE AND LEG orif Right 1984   TRAUMA  . LEFT AND RIGHT HEART CATHETERIZATION WITH CORONARY ANGIOGRAM N/A 03/08/2011   Procedure: LEFT AND RIGHT HEART CATHETERIZATION WITH CORONARY ANGIOGRAM;  Surgeon: Iran Ouch, MD;  Location: [facility] CATH LAB;  Service: Cardiovascular;  Laterality: N/A;  . LUMBAR FUSION  2015  . plastic surgery right eye  1981  . RIGHT FOREARM ORIF  1981   TRAUMA  . TOTAL HIP ARTHROPLASTY Left 1Apr 22, 202016  . TOTAL HIP ARTHROPLASTY Left 1Apr 22, 202016   Procedure: TOTAL HIP ARTHROPLASTY;  Surgeon: Valeria Batman, MD;  Location: [facility] OR;  Service: Orthopedics;  Laterality: Left;  . WISDOM TOOTH EXTRACTION     Social History   Occupational History  . PAINTER    Social History Main Topics  . Smoking status: Former Smoker    Packs/day: 1.00    Years: 6.00    Types: Cigarettes    Start date: 02/03/2007    Quit date: 01/25/2015  . Smokeless tobacco:  Former Neurosurgeon    Quit date: 10/16/1996  . Alcohol use No  . Drug use: No  . Sexual activity: Not Currently    Partners: Female

## 2017-08-29 ENCOUNTER — Encounter (INDEPENDENT_AMBULATORY_CARE_PROVIDER_SITE_OTHER): Payer: Self-pay | Admitting: Orthopaedic Surgery

## 2017-08-29 ENCOUNTER — Ambulatory Visit (INDEPENDENT_AMBULATORY_CARE_PROVIDER_SITE_OTHER): Payer: Medicaid Other | Admitting: Orthopaedic Surgery

## 2017-08-29 ENCOUNTER — Ambulatory Visit (INDEPENDENT_AMBULATORY_CARE_PROVIDER_SITE_OTHER): Payer: Medicaid Other

## 2017-08-29 VITALS — BP 128/65 | HR 73 | Ht 68.0 in | Wt 206.0 lb

## 2017-08-29 DIAGNOSIS — M25532 Pain in left wrist: Secondary | ICD-10-CM
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
## 2017-09-29 IMAGING — CR DG HIP (WITH OR WITHOUT PELVIS) 1V PORT*L*
3 series · 3 of 3 positions shown · non-contrast
Comparison: May 19, 2014.

CLINICAL DATA: Status post left hip replacement.

EXAM:
DG HIP (WITH OR WITHOUT PELVIS) 1V PORT LEFT

[AP (1 of 2)]
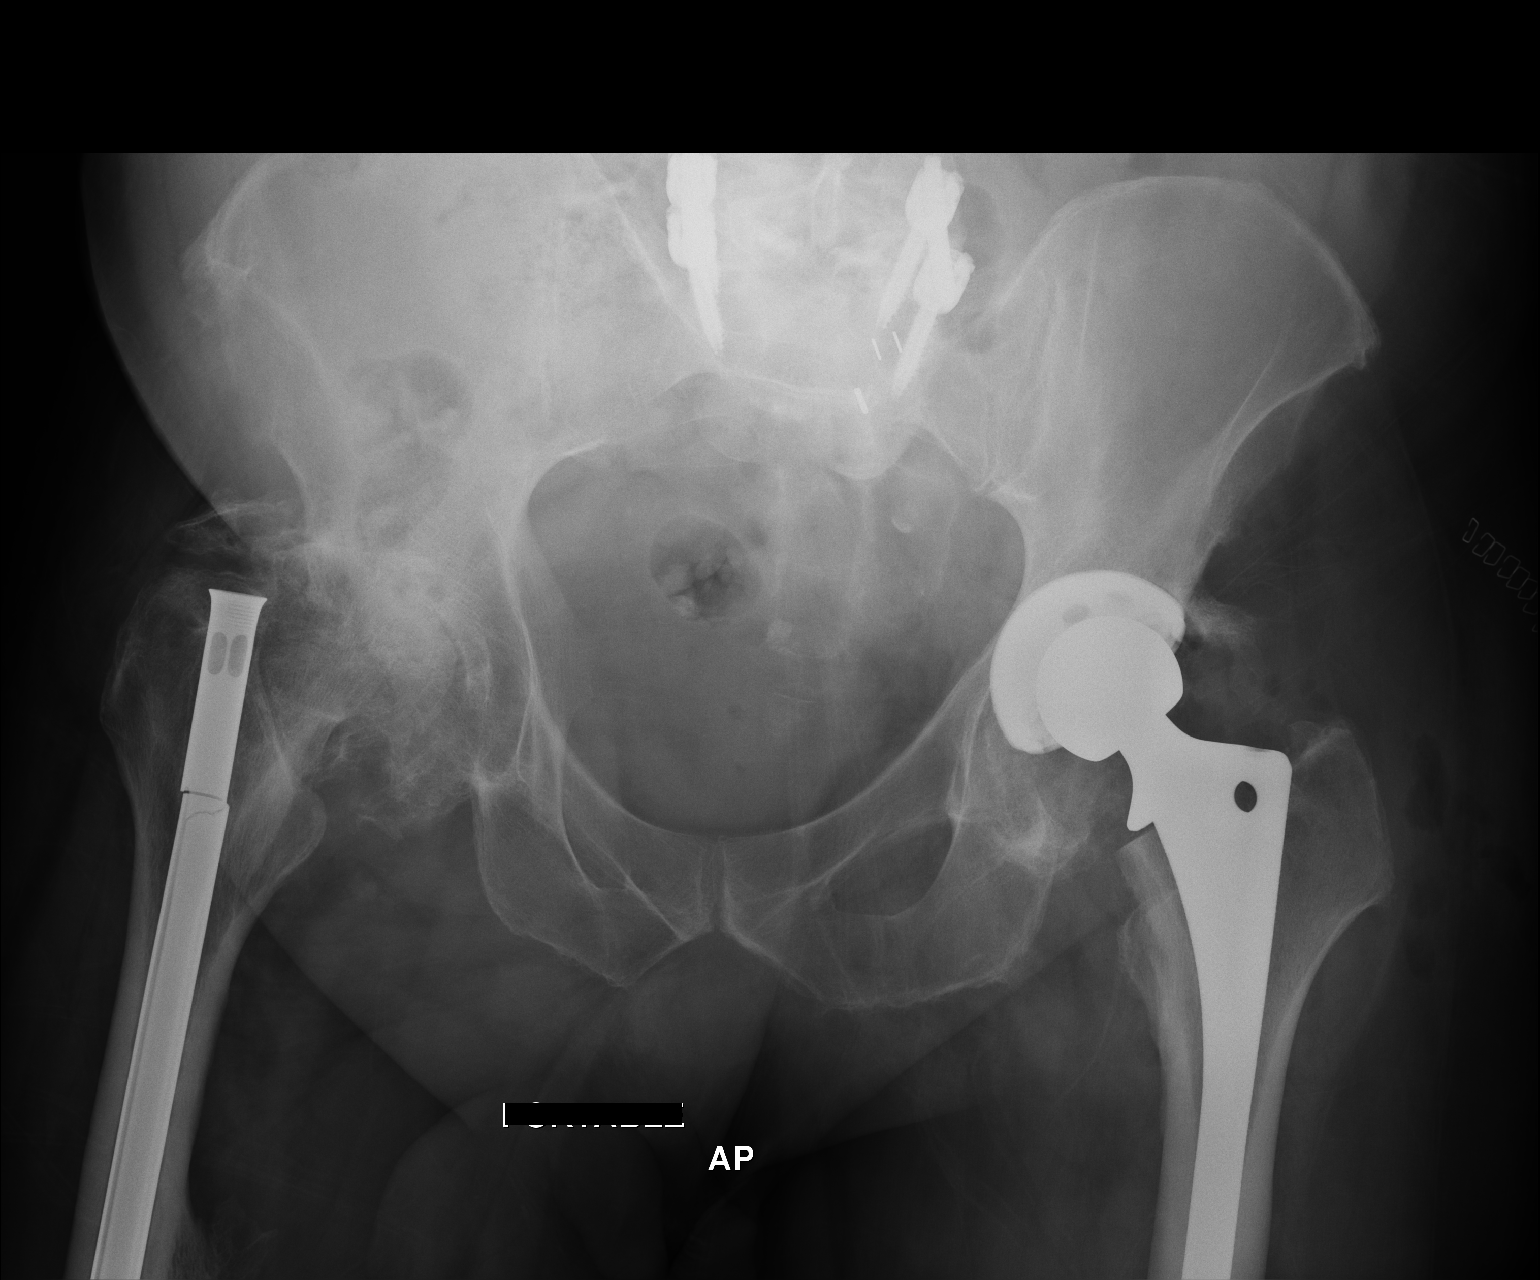

[AP (2 of 2)]
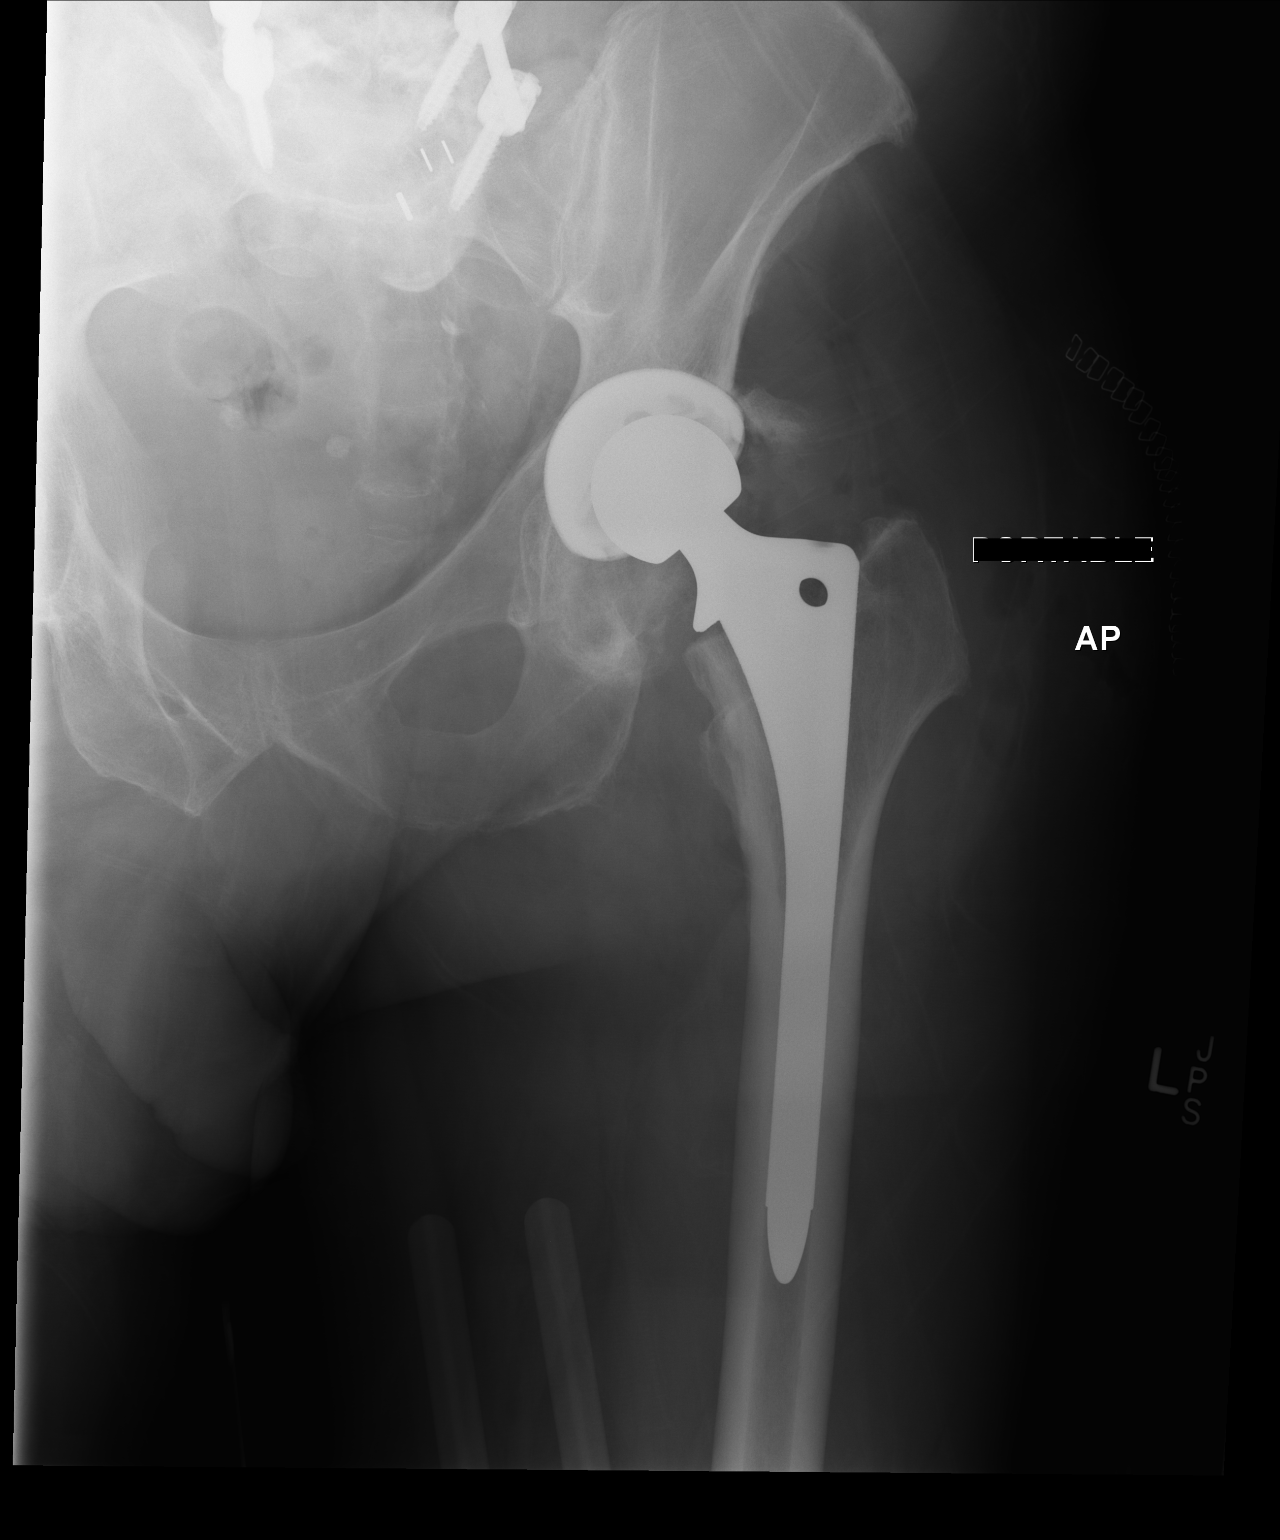

[xtable lateral]
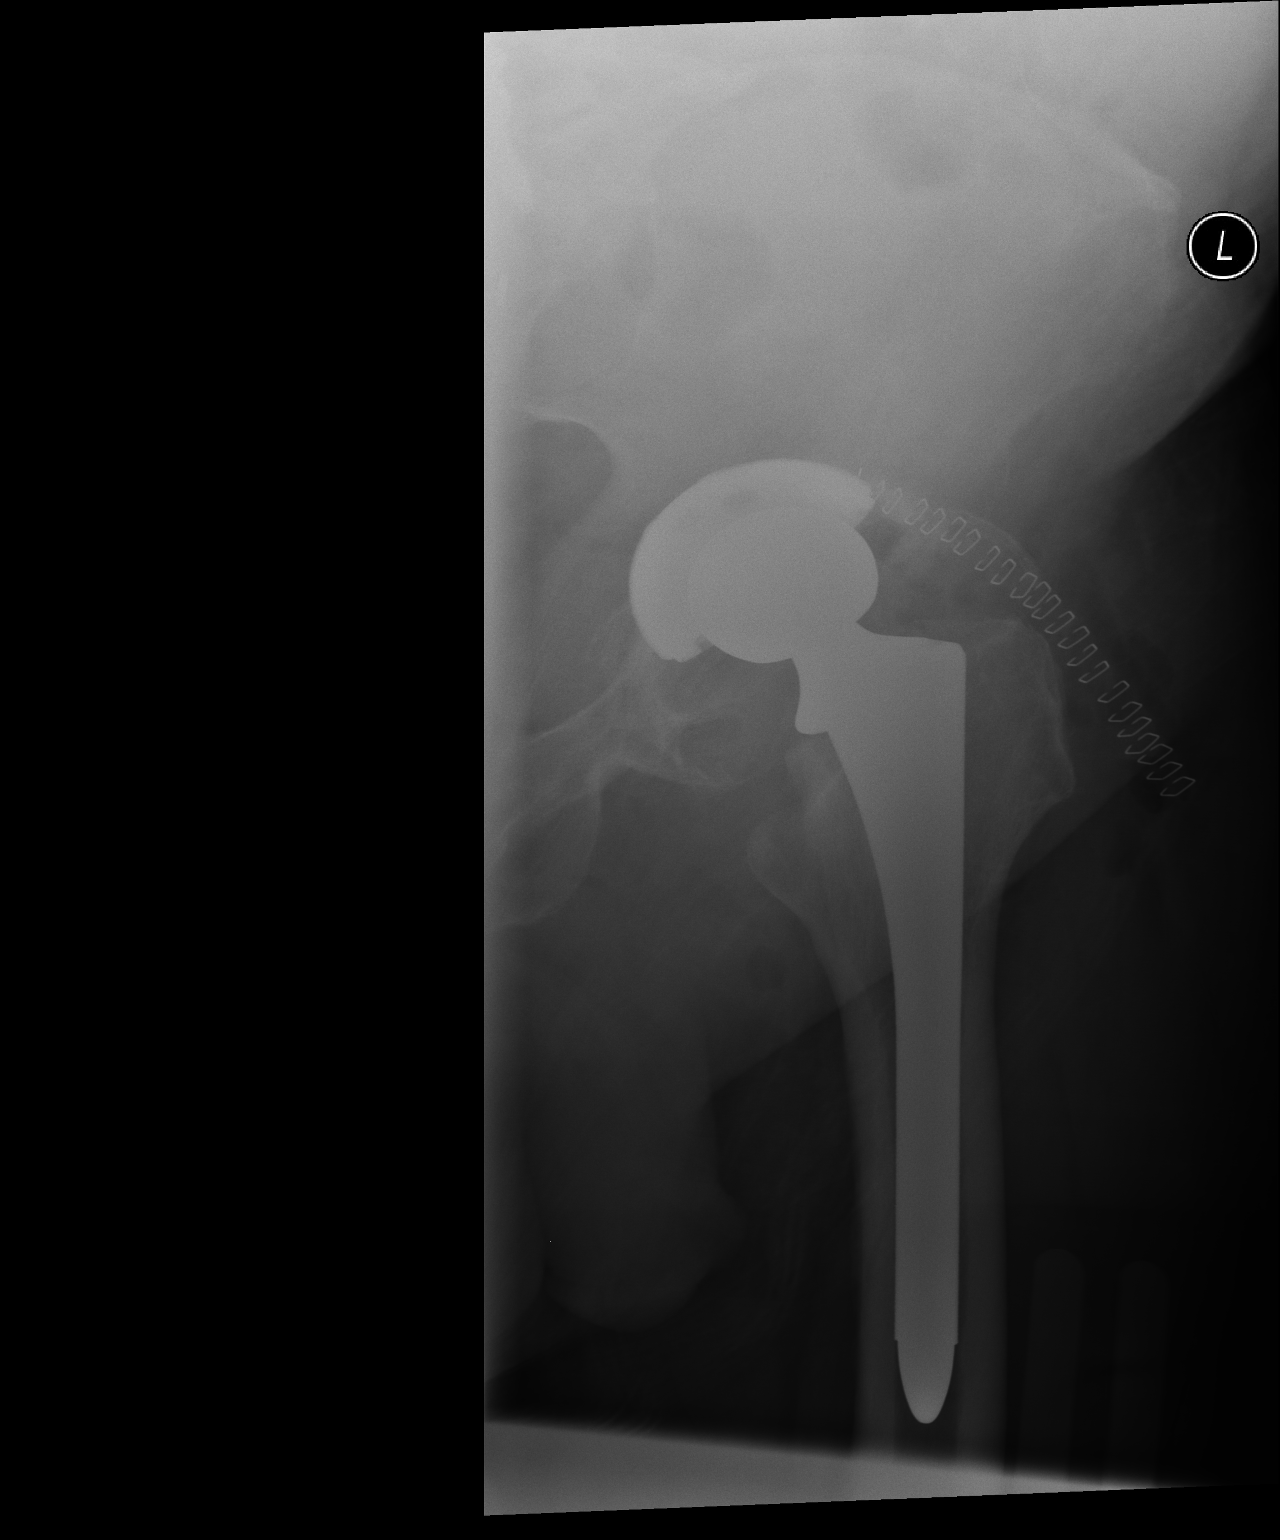

[3 of 3 positions shown; findings below may reference images not displayed]

FINDINGS: Status post left total hip arthroplasty. The femoral and acetabular
components appear to be well situated. Severe degenerative change of
the right hip is noted. No acute fracture or dislocation is noted.
IMPRESSION: Status post left total hip arthroplasty.

## 2017-11-29 ENCOUNTER — Telehealth: Payer: Self-pay | Admitting: Cardiovascular Disease

## 2017-11-29 NOTE — Telephone Encounter (Signed)
°  Precert needed for:  Echo    Location: [facility]     Date: Jan 23, 2018

## 2017-12-14 ENCOUNTER — Telehealth: Payer: Self-pay | Admitting: Cardiovascular Disease

## 2017-12-14 NOTE — Telephone Encounter (Signed)
°*  STAT* If patient is at the pharmacy, call can be transferred to refill team.   1. Which medications need to be refilled?  sildenafil (REVATIO) 20 MG tablet   2. Which pharmacy/location (including street and city if local pharmacy) is medication to be sent to?[facility] [place]  3. Do they need a 30 day or 90 day supply?

## 2017-12-17 NOTE — Telephone Encounter (Signed)
I've not seen this patient in nearly 3 years. I'm also not the prescriber for this medication. Hence, I am unaware of his indication for this. I defer to his PCP or the provider who initially prescribed it.

## 2017-12-17 NOTE — Telephone Encounter (Signed)
Patient informed and verbalized understanding

## 2017-12-27 ENCOUNTER — Other Ambulatory Visit: Payer: Self-pay | Admitting: Cardiovascular Disease

## 2017-12-27 DIAGNOSIS — I2510 Atherosclerotic heart disease of native coronary artery without angina pectoris: Secondary | ICD-10-CM

## 2017-12-27 DIAGNOSIS — I359 Nonrheumatic aortic valve disorder, unspecified: Secondary | ICD-10-CM

## 2018-01-16 ENCOUNTER — Ambulatory Visit (INDEPENDENT_AMBULATORY_CARE_PROVIDER_SITE_OTHER): Payer: Medicaid Other

## 2018-01-16 ENCOUNTER — Other Ambulatory Visit: Payer: Self-pay

## 2018-01-16 DIAGNOSIS — I359 Nonrheumatic aortic valve disorder, unspecified: Secondary | ICD-10-CM | POA: Diagnosis not present

## 2018-01-16 DIAGNOSIS — I2510 Atherosclerotic heart disease of native coronary artery without angina pectoris: Secondary | ICD-10-CM

## 2018-01-18 ENCOUNTER — Telehealth: Payer: Self-pay | Admitting: *Deleted

## 2018-01-18 NOTE — Telephone Encounter (Signed)
Notes recorded by Lesle Chris, LPN on 16/12/9602 at 5:45 PM EDT Patient notified. Copy to pmd. Follow up scheduled for 01/23/2018 with Dr. Purvis Sheffield in [place].   ------  Notes recorded by Laqueta Linden, MD on 01/17/2018 at 9:46 AM EDT This study demonstrates: Normal pumping function. Calcium buildup on aortic valve leaflets leading to mild narrowing of opening and moderate valve leakage. Medication changes / Follow up studies / Other recommendations:  Findings are stable when compared to echocardiogram in October 2016. Please send results to the PCP: Avon Gully, MD  Prentice Docker, MD 01/17/2018 9:44 AM

## 2018-01-23 ENCOUNTER — Ambulatory Visit: Payer: Medicaid Other | Admitting: Cardiovascular Disease

## 2018-01-23 ENCOUNTER — Encounter: Payer: Self-pay | Admitting: Cardiovascular Disease

## 2018-01-23 VITALS — BP 128/74 | HR 108 | Ht 68.0 in | Wt 188.0 lb

## 2018-01-23 DIAGNOSIS — N529 Male erectile dysfunction, unspecified: Secondary | ICD-10-CM

## 2018-01-23 DIAGNOSIS — I351 Nonrheumatic aortic (valve) insufficiency: Secondary | ICD-10-CM

## 2018-01-23 DIAGNOSIS — I10 Essential (primary) hypertension: Secondary | ICD-10-CM

## 2018-01-23 DIAGNOSIS — I77819 Aortic ectasia, unspecified site: Secondary | ICD-10-CM | POA: Diagnosis not present

## 2018-01-23 DIAGNOSIS — I350 Nonrheumatic aortic (valve) stenosis: Secondary | ICD-10-CM

## 2018-01-23 MED ORDER — SILDENAFIL CITRATE 20 MG PO TABS: ORAL_TABLET | ORAL | 2 refills | Status: DC
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
## 2018-01-23 NOTE — Patient Instructions (Signed)

## 2018-04-27 DEATH — deceased

## 2019-02-18 ENCOUNTER — Telehealth: Payer: Self-pay | Admitting: Cardiovascular Disease

## 2019-02-18 NOTE — Telephone Encounter (Signed)
Virtual Visit Pre-Appointment Phone Call  "(Name), I am calling you today to discuss your upcoming appointment. We are currently trying to limit exposure to the virus that causes COVID-19 by seeing patients at home rather than in the office."  1. "What is the BEST phone number to call the day of the visit?" - include this in appointment notes  2. Do you have or have access to (through a family member/friend) a smartphone with video capability that we can use for your visit?" a. If yes - list this number in appt notes as cell (if different from BEST phone #) and list the appointment type as a VIDEO visit in appointment notes b. If no - list the appointment type as a PHONE visit in appointment notes  3. Confirm consent - "In the setting of the current Covid19 crisis, you are scheduled for a (phone or video) visit with your provider on (date) at (time).  Just as we do with many in-office visits, in order for you to participate in this visit, we must obtain consent.  If you'd like, I can send this to your mychart (if signed up) or email for you to review.  Otherwise, I can obtain your verbal consent now.  All virtual visits are billed to your insurance company just like a normal visit would be.  By agreeing to a virtual visit, we'd like you to understand that the technology does not allow for your provider to perform an examination, and thus may limit your provider's ability to fully assess your condition. If your provider identifies any concerns that need to be evaluated in person, we will make arrangements to do so.  Finally, though the technology is pretty good, we cannot assure that it will always work on either your or our end, and in the setting of a video visit, we may have to convert it to a phone-only visit.  In either situation, we cannot ensure that we have a secure connection.  Are you willing to proceed?" STAFF: Did the patient verbally acknowledge consent to telehealth visit? Document  YES/NO here: YES  4. Advise patient to be prepared - "Two hours prior to your appointment, go ahead and check your blood pressure, pulse, oxygen saturation, and your weight (if you have the equipment to check those) and write them all down. When your visit starts, your provider will ask you for this information. If you have an Apple Watch or Kardia device, please plan to have heart rate information ready on the day of your appointment. Please have a pen and paper handy nearby the day of the visit as well."  5. Give patient instructions for MyChart download to smartphone OR Doximity/Doxy.me as below if video visit (depending on what platform provider is using)  6. Inform patient they will receive a phone call 15 minutes prior to their appointment time (may be from unknown caller ID) so they should be prepared to answer    TELEPHONE CALL NOTE  MAJID MCCRAVY has been deemed a candidate for a follow-up tele-health visit to limit community exposure during the Covid-19 pandemic. I spoke with the patient via phone to ensure availability of phone/video source, confirm preferred email & phone number, and discuss instructions and expectations.  I reminded Lucas Curry to be prepared with any vital sign and/or heart rhythm information that could potentially be obtained via home monitoring, at the time of his visit. I reminded DONNOVAN STAMOUR to expect a phone call prior to  his visit.  Geraldine Contras 02/18/2019 11:33 AM   INSTRUCTIONS FOR DOWNLOADING THE MYCHART APP TO SMARTPHONE  - The patient must first make sure to have activated MyChart and know their login information - If Apple, go to [employer] and type in MyChart in the search bar and download the app. If Android, ask patient to go to [employer] and type in [place] in the search bar and download the app. The app is free but as with any other app downloads, their phone may require them to verify saved payment information or  Apple/Android password.  - The patient will need to then log into the app with their MyChart username and password, and select  as their healthcare provider to link the account. When it is time for your visit, go to the MyChart app, find appointments, and click Begin Video Visit. Be sure to Select Allow for your device to access the Microphone and Camera for your visit. You will then be connected, and your provider will be with you shortly.  **If they have any issues connecting, or need assistance please contact MyChart service desk [phone])**  **If using a computer, in order to ensure the best quality for their visit they will need to use either of the following Internet Browsers: [employer] or Google Chrome**  IF USING DOXIMITY or DOXY.ME - The patient will receive a link just prior to their visit by text.     FULL LENGTH CONSENT FOR TELE-HEALTH VISIT   I hereby voluntarily request, consent and authorize [facility] and its employed or contracted physicians, physician assistants, nurse practitioners or other licensed health care professionals (the Practitioner), to provide me with telemedicine health care services (the Services") as deemed necessary by the treating Practitioner. I acknowledge and consent to receive the Services by the Practitioner via telemedicine. I understand that the telemedicine visit will involve communicating with the Practitioner through live audiovisual communication technology and the disclosure of certain medical information by electronic transmission. I acknowledge that I have been given the opportunity to request an in-person assessment or other available alternative prior to the telemedicine visit and am voluntarily participating in the telemedicine visit.  I understand that I have the right to withhold or withdraw my consent to the use of telemedicine in the course of my care at any time, without affecting my right to future care  or treatment, and that the Practitioner or I may terminate the telemedicine visit at any time. I understand that I have the right to inspect all information obtained and/or recorded in the course of the telemedicine visit and may receive copies of available information for a reasonable fee.  I understand that some of the potential risks of receiving the Services via telemedicine include:   Delay or interruption in medical evaluation due to technological equipment failure or disruption;  Information transmitted may not be sufficient (e.g. poor resolution of images) to allow for appropriate medical decision making by the Practitioner; and/or   In rare instances, security protocols could fail, causing a breach of personal health information.  Furthermore, I acknowledge that it is my responsibility to provide information about my medical history, conditions and care that is complete and accurate to the best of my ability. I acknowledge that Practitioner's advice, recommendations, and/or decision may be based on factors not within their control, such as incomplete or inaccurate data provided by me or distortions of diagnostic images or specimens that may result from electronic transmissions. I  understand that the practice of medicine is not an exact science and that Practitioner makes no warranties or guarantees regarding treatment outcomes. I acknowledge that I will receive a copy of this consent concurrently upon execution via email to the email address I last provided but may also request a printed copy by calling the office of [place].    I understand that my insurance will be billed for this visit.   I have read or had this consent read to me.  I understand the contents of this consent, which adequately explains the benefits and risks of the Services being provided via telemedicine.   I have been provided ample opportunity to ask questions regarding this consent and the Services and have had  my questions answered to my satisfaction.  I give my informed consent for the services to be provided through the use of telemedicine in my medical care  By participating in this telemedicine visit I agree to the above.

## 2019-02-26 ENCOUNTER — Encounter: Payer: Self-pay | Admitting: *Deleted

## 2019-02-26 ENCOUNTER — Encounter: Payer: Self-pay | Admitting: Cardiovascular Disease

## 2019-02-26 ENCOUNTER — Telehealth (INDEPENDENT_AMBULATORY_CARE_PROVIDER_SITE_OTHER): Payer: Medicare Other | Admitting: Cardiovascular Disease

## 2019-02-26 VITALS — BP 130/75 | Ht 68.0 in | Wt 160.0 lb

## 2019-02-26 DIAGNOSIS — N529 Male erectile dysfunction, unspecified: Secondary | ICD-10-CM

## 2019-02-26 DIAGNOSIS — I10 Essential (primary) hypertension: Secondary | ICD-10-CM

## 2019-02-26 DIAGNOSIS — I350 Nonrheumatic aortic (valve) stenosis: Secondary | ICD-10-CM | POA: Diagnosis not present

## 2019-02-26 DIAGNOSIS — I351 Nonrheumatic aortic (valve) insufficiency: Secondary | ICD-10-CM

## 2019-02-26 DIAGNOSIS — I77819 Aortic ectasia, unspecified site: Secondary | ICD-10-CM

## 2019-02-26 MED ORDER — SILDENAFIL CITRATE 20 MG PO TABS: ORAL_TABLET | ORAL | 2 refills | Status: AC

## 2019-02-26 NOTE — Addendum Note (Signed)
Addended by: Merlene Laughter on: 02/26/2019 02:32 PM   Modules accepted: Orders

## 2019-02-26 NOTE — Patient Instructions (Addendum)

## 2019-02-26 NOTE — Progress Notes (Signed)
Virtual Visit via Telephone Note   This visit type was conducted due to national recommendations for restrictions regarding the COVID-19 Pandemic (e.g. social distancing) in an effort to limit this patient's exposure and mitigate transmission in our community.  Due to his co-morbid illnesses, this patient is at least at moderate risk for complications without adequate follow up.  This format is felt to be most appropriate for this patient at this time.  The patient did not have access to video technology/had technical difficulties with video requiring transitioning to audio format only (telephone).  All issues noted in this document were discussed and addressed.  No physical exam could be performed with this format.  Please refer to the patient's chart for his  consent to telehealth for [facility].   Date:  02/26/2019   ID:  Lucas Curry, DOB Mar 14, 1956, MRN [id number]  Patient Location: Home Provider Location: Office  PCP:  Avon Gully, MD  Cardiologist:  Prentice Docker, MD  Electrophysiologist:  None   Evaluation Performed:  Follow-Up Visit  Chief Complaint:  Aortic valve disorder  History of Present Illness:    Lucas Curry is a 63 y.o. male with possible bicuspid aortic valve with with mild aortic stenosis and moderate aortic regurgitation.  The patient denies any symptoms of chest pain, palpitations, shortness of breath, lightheadedness, dizziness, leg swelling, orthopnea, PND, and syncope.  He has had significant weight loss. He denies hematochezia and melena. He denies diarrhea.   Past Medical History:  Diagnosis Date  . Abdominal pain    chronic secondary to scar  . Aortic ectasia, unspecified site [facility])    Aortic dilatation  . Aortic valve disorders    moderate to severe aortic insufficiency both by catheterization and echocardiogram.  Ejection fraction 55-60%, low cardiac output.  Cardiac catheterization 1212 calculated valve area 1.14 cm made  the abnormal secondary to aortic insufficiency and no significant coronary artery disease.  Normal filling pressures without significant pulmonary hypertension  . Chronic pain due to trauma    patient walks with a limp status post pinning of his right leg  . Coronary artery disease (CAD) excluded    Status post cardiac catheterization  . ETOH abuse   . Heart murmur   . Hypertension   . Limb pain   . Palpitations   . Peripheral vascular disease, unspecified (HCC)    normal ABIs 2012  . Problems with hearing   . Sciatica   . 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 Procedure January 2012   Past Surgical History:  Procedure Laterality Date  . ABDOMINAL SURGERY  1981   due trauma reports that he has pins and some in chest  . KNEE AND LEG orif Right 1984   TRAUMA  . LEFT AND RIGHT HEART CATHETERIZATION WITH CORONARY ANGIOGRAM N/A 03/08/2011   Procedure: LEFT AND RIGHT HEART CATHETERIZATION WITH CORONARY ANGIOGRAM;  Surgeon: Iran Ouch, MD;  Location: [facility] CATH LAB;  Service: Cardiovascular;  Laterality: N/A;  . LUMBAR FUSION  2015  . plastic surgery right eye  1981  . RIGHT FOREARM ORIF  1981   TRAUMA  . TOTAL HIP ARTHROPLASTY Left 02/23/2015  . TOTAL HIP ARTHROPLASTY Left 02/23/2015   Procedure: TOTAL HIP ARTHROPLASTY;  Surgeon: Valeria Batman, MD;  Location: [facility] OR;  Service: Orthopedics;  Laterality: Left;  . WISDOM TOOTH EXTRACTION  Current Meds  Medication Sig  . aspirin EC 81 MG tablet Take 81 mg by mouth.  . furosemide (LASIX) 40 MG tablet Take 40 mg by mouth 2 (two) times daily.  Marland Kitchen ibuprofen (ADVIL) 600 MG tablet Take 600 mg by mouth 2 (two) times daily.  Marland Kitchen lisinopril (PRINIVIL,ZESTRIL) 20 MG tablet Take 1 tablet (20 mg total) by mouth daily.  . methocarbamol (ROBAXIN) 500 MG tablet Take 1 tablet (500 mg total) by mouth every 8 (eight)  hours as needed for muscle spasms.  Marland Kitchen oxyCODONE-acetaminophen (PERCOCET) 7.5-325 MG tablet Take 1 tablet by mouth every 6 (six) hours as needed.   . potassium chloride SA (KLOR-CON) 20 MEQ tablet Take 20 mEq by mouth daily.  . sildenafil (REVATIO) 20 MG tablet TAKE 2 TO 3 TABLETS AS NEEDED  . [DISCONTINUED] furosemide (LASIX) 20 MG tablet Take 40 mg by mouth 2 (two) times daily.      Allergies:   Patient has no known allergies.   Social History   Tobacco Use  . Smoking status: Current Every Day Smoker    Packs/day: 1.00    Years: 6.00    Pack years: 6.00    Types: Cigarettes, Cigars    Start date: 02/03/2007    Last attempt to quit: 01/25/2015    Years since quitting: 4.0  . Smokeless tobacco: Former Systems developer    Quit date: 10/16/1996  . Tobacco comment: quit cigarettes-smokes 2 cigars daily  Substance Use Topics  . Alcohol use: No    Alcohol/week: 0.0 standard drinks  . Drug use: No     Family Hx: The patient's family history is not on file.  ROS:   Please see the history of present illness.     All other systems reviewed and are negative.   Prior CV studies:   The following studies were reviewed today:  Echocardiogram 01/16/2018 demonstrated normal left ventricular systolic function, LVEF 60 to 65%, possible bicuspid aortic valve with with mild aortic stenosis and moderate aortic regurgitation.  The aortic root was mildly dilated.  There was mild mitral regurgitation.  Labs/Other Tests and Data Reviewed:    EKG:  No ECG reviewed.  Recent Labs: No results found for requested labs within last 8760 hours.   Recent Lipid Panel No results found for: CHOL, TRIG, HDL, CHOLHDL, LDLCALC, LDLDIRECT  Wt Readings from Last 3 Encounters:  02/26/19 160 lb (72.6 kg)  01/23/18 188 lb (85.3 kg)  08/29/17 206 lb (93.4 kg)     Objective:    Vital Signs:  BP 130/75   Ht 5\' 8"  (1.727 m)   Wt 160 lb (72.6 kg)   BMI 24.33 kg/m    VITAL SIGNS:  reviewed  ASSESSMENT & PLAN:     1.  Possible bicuspid aortic valve with aortic stenosis and regurgitation: Symptomatically stable.  Mild aortic stenosis with moderate aortic regurgitation.  Regurgitation is stable since 2016.  I will continue routine surveillance monitoring clinically and by echocardiography.  2.  Aortic root dilatation: This remains mild in severity as noted by echocardiogram in October 2019.  I will continue routine monitoring.  3.  Hypertension: Blood pressure is normal.  No changes to therapy.  4.  Erectile dysfunction: Insurance would not pay for Viagra or Cialis so he is prescribed Revatio.   COVID-19 Education: The signs and symptoms of COVID-19 were discussed with the patient and how to seek care for testing (follow up with PCP or arrange E-visit).  The importance of social distancing was  discussed today.  Time:   Today, I have spent 10 minutes with the patient with telehealth technology discussing the above problems.     Medication Adjustments/Labs and Tests Ordered: Current medicines are reviewed at length with the patient today.  Concerns regarding medicines are outlined above.   Tests Ordered: No orders of the defined types were placed in this encounter.   Medication Changes: No orders of the defined types were placed in this encounter.   Follow Up:  Virtual Visit  in 1 year(s)  Signed, Prentice DockerSuresh Kailani Brass, MD  02/26/2019 2:22 PM    [facility]

## 2019-03-27 MED ORDER — GENERIC EXTERNAL MEDICATION
4.00 | Status: DC
Start: ? — End: 2019-03-27

## 2019-03-27 MED ORDER — CARBOXYMETHYLCELLULOSE SODIUM 0.5 % OP SOLN
1.00 | OPHTHALMIC | Status: DC
Start: ? — End: 2019-03-27

## 2019-03-27 MED ORDER — MORPHINE SULFATE 4 MG/ML IJ SOLN
2.00 | INTRAMUSCULAR | Status: DC
Start: ? — End: 2019-03-27

## 2019-03-27 MED ORDER — SODIUM CHLORIDE 0.9 % IV SOLN
INTRAVENOUS | Status: DC
Start: ? — End: 2019-03-27

## 2019-03-27 MED ORDER — GLYCOPYRROLATE 0.4 MG/2ML IJ SOLN
0.10 | INTRAMUSCULAR | Status: DC
Start: ? — End: 2019-03-27

## 2019-03-27 MED ORDER — BISACODYL 10 MG RE SUPP
10.00 | RECTAL | Status: DC
Start: ? — End: 2019-03-27

## 2019-03-28 DEATH — deceased
